# Patient Record
Sex: Male | Born: 1975
Health system: Southern US, Community
[De-identification: ages and names within clinical notes are randomized; demographics above are authoritative.]

## PROBLEM LIST (undated history)

## (undated) DIAGNOSIS — T7840XA Allergy, unspecified, initial encounter: Secondary | ICD-10-CM

## (undated) DIAGNOSIS — J40 Bronchitis, not specified as acute or chronic: Secondary | ICD-10-CM

## (undated) DIAGNOSIS — C801 Malignant (primary) neoplasm, unspecified: Secondary | ICD-10-CM

## (undated) HISTORY — DX: Allergy, unspecified, initial encounter: T78.40XA

## (undated) HISTORY — PX: OTHER SURGICAL HISTORY: SHX169

## (undated) HISTORY — PX: EYE SURGERY: SHX253

## (undated) HISTORY — PX: TONSILLECTOMY: SUR1361

---

## 2012-09-18 ENCOUNTER — Ambulatory Visit (INDEPENDENT_AMBULATORY_CARE_PROVIDER_SITE_OTHER): Payer: Managed Care, Other (non HMO) | Admitting: Family Medicine

## 2012-09-18 VITALS — BP 130/84 | HR 88 | Temp 97.6°F | Resp 16 | Ht 71.75 in | Wt 220.0 lb

## 2012-09-18 DIAGNOSIS — R21 Rash and other nonspecific skin eruption: Secondary | ICD-10-CM

## 2012-09-18 LAB — COMPREHENSIVE METABOLIC PANEL
ALT: 33 U/L (ref 0–53)
AST: 19 U/L (ref 0–37)
Albumin: 4.4 g/dL (ref 3.5–5.2)
Alkaline Phosphatase: 103 U/L (ref 39–117)
BUN: 11 mg/dL (ref 6–23)
CO2: 28 mEq/L (ref 19–32)
Calcium: 9.6 mg/dL (ref 8.4–10.5)
Chloride: 104 mEq/L (ref 96–112)
Creat: 0.76 mg/dL (ref 0.50–1.35)
Glucose, Bld: 95 mg/dL (ref 70–99)
Potassium: 4.3 mEq/L (ref 3.5–5.3)
Sodium: 140 mEq/L (ref 135–145)
Total Bilirubin: 0.6 mg/dL (ref 0.3–1.2)
Total Protein: 7.6 g/dL (ref 6.0–8.3)

## 2012-09-18 LAB — POCT CBC
Granulocyte percent: 67.1 %G (ref 37–80)
HCT, POC: 52.4 % (ref 43.5–53.7)
Hemoglobin: 16.4 g/dL (ref 14.1–18.1)
Lymph, poc: 2 (ref 0.6–3.4)
MCH, POC: 28.4 pg (ref 27–31.2)
MCHC: 31.3 g/dL — AB (ref 31.8–35.4)
MCV: 90.8 fL (ref 80–97)
MID (cbc): 0.7 (ref 0–0.9)
MPV: 8.2 fL (ref 0–99.8)
POC Granulocyte: 5.4 (ref 2–6.9)
POC LYMPH PERCENT: 24.5 %L (ref 10–50)
POC MID %: 8.4 %M (ref 0–12)
Platelet Count, POC: 300 10*3/uL (ref 142–424)
RBC: 5.77 M/uL (ref 4.69–6.13)
RDW, POC: 14.2 %
WBC: 8 10*3/uL (ref 4.6–10.2)

## 2012-09-18 LAB — POCT SEDIMENTATION RATE: POCT SED RATE: 15 mm/hr (ref 0–22)

## 2012-09-18 MED ORDER — KETOCONAZOLE 2 % EX CREA: TOPICAL_CREAM | Freq: Two times a day (BID) | CUTANEOUS | Status: DC

## 2012-09-18 MED ORDER — DOXYCYCLINE HYCLATE 100 MG PO TABS: 100.0000 mg | ORAL_TABLET | Freq: Two times a day (BID) | ORAL | Status: DC

## 2012-09-18 NOTE — Progress Notes (Signed)
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 It began about a week ago it is nonpruritic and nonpainful. The onset was associated with malleus for one day. He's tried antifungal creams but hasn't worked.  Patient denies subsequent fever, headache, stiff neck, chest pain, shortness of breath  Objective: Patient has a 3-4 cm in diameter erythematous slightly raised rash which is confluent along the perimeter but does show some central clearing. There is increased pink margins at the periphery. The rash does not blanch.  Assessment: Petechial rash in an annular distribution just above the right ankle which is consistent with Lyme disease or other tickborne pathogens  Plan: Check labs, doxycycline, Nizoral cream 1. Rash  POCT CBC, POCT SEDIMENTATION RATE, Comprehensive metabolic panel, B. burgdorfi antibodies, [place] spotted fvr ab, IgM-blood, doxycycline (VIBRA-TABS) 100 MG tablet, ketoconazole (NIZORAL) 2 % cream

## 2012-09-19 LAB — B. BURGDORFI ANTIBODIES: B burgdorferi Ab IgG+IgM: 0.23 {ISR}

## 2012-09-19 LAB — ROCKY MTN SPOTTED FVR AB, IGM-BLOOD: ROCKY MTN SPOTTED FEVER, IGM: 0.32 IV

## 2013-06-03 ENCOUNTER — Ambulatory Visit (INDEPENDENT_AMBULATORY_CARE_PROVIDER_SITE_OTHER): Payer: Self-pay | Admitting: Internal Medicine

## 2013-06-03 VITALS — BP 159/88 | HR 114 | Temp 100.5°F | Resp 18 | Ht 71.75 in | Wt 221.8 lb

## 2013-06-03 DIAGNOSIS — J029 Acute pharyngitis, unspecified: Secondary | ICD-10-CM

## 2013-06-03 DIAGNOSIS — R112 Nausea with vomiting, unspecified: Secondary | ICD-10-CM

## 2013-06-03 DIAGNOSIS — R21 Rash and other nonspecific skin eruption: Secondary | ICD-10-CM

## 2013-06-03 DIAGNOSIS — R509 Fever, unspecified: Secondary | ICD-10-CM

## 2013-06-03 MED ORDER — DOXYCYCLINE HYCLATE 100 MG PO TABS: 100.0000 mg | ORAL_TABLET | Freq: Two times a day (BID) | ORAL | Status: DC

## 2013-06-03 MED ORDER — ONDANSETRON HCL 8 MG PO TABS: 8.0000 mg | ORAL_TABLET | Freq: Three times a day (TID) | ORAL | Status: DC | PRN

## 2013-06-03 MED ORDER — ONDANSETRON 4 MG PO TBDP
8.0000 mg | ORAL_TABLET | Freq: Once | ORAL | Status: AC
  Administered 2013-06-03: 8 mg via ORAL

## 2013-06-03 NOTE — Progress Notes (Signed)
  Subjective:    Patient ID: Scott Turner, male    DOB: 26-Jul-1976, 37 y.o.   MRN: [id number]  HPI Pt here c/o of cough, sore throat, fever of 99.8, and vomiting x 3, body aches . Started yesterday. Denies headache, diarrhea, or abdominal pain. Decreased appetite, urinating okay , is a little dizzy. No alcohol use.   Zofran odt 8mg  now po   Review of Systems     Objective:   Physical Exam  Vitals reviewed. Constitutional: He is oriented to person, place, and time. He appears well-developed and well-nourished. No distress.  HENT:  Right Ear: External ear normal.  Left Ear: External ear normal.  Eyes: EOM are normal. No scleral icterus.  Neck: Normal range of motion.  Cardiovascular: Normal rate, regular rhythm and normal heart sounds.   Pulmonary/Chest: Effort normal and breath sounds normal.  Abdominal: Soft. Bowel sounds are normal. There is no tenderness.  Neurological: He is alert and oriented to person, place, and time. He exhibits normal muscle tone. Coordination normal.  Skin: No rash noted.  Psychiatric: He has a normal mood and affect.      rst    Results for orders placed in visit on 06/03/13  POCT RAPID STREP A (OFFICE)      Result Value Range   Rapid Strep A Screen Negative  Negative    Assessment & Plan:  Zofran/Doxy

## 2013-06-03 NOTE — Patient Instructions (Addendum)
Wood Tick Bite Ticks are insects that attach themselves to the skin. Most tick bites are harmless, but sometimes ticks carry diseases that can make a person quite ill. The chance of getting ill depends on:  The kind of tick that bites you.  Time of year.  How long the tick is attached.  Geographic location. Wood ticks are also called dog ticks. They are generally black. They can have white markings. They live in shrubs and grassy areas. They are larger than deer ticks. Wood ticks are about the size of a watermelon seed. They have a hard body. The most common places for ticks to attach themselves are the scalp, neck, armpits, waist, and groin. Wood ticks may stay attached for up to 2 weeks. TICKS MUST BE REMOVED AS SOON AS POSSIBLE TO HELP PREVENT DISEASES CAUSED BY TICK BITES.  TO REMOVE A TICK: 1. If available, put on latex gloves before trying to remove a tick. 2. Grasp the tick as close to the skin as possible, with curved forceps, fine tweezers or a special tick removal tool. 3. Pull gently with steady pressure until the tick lets go. Do not twist the tick or jerk it suddenly. This may break off the tick's head or mouth parts. 4. Do not crush the tick's body. This could force disease-carrying fluids from the tick into your body. 5. After the tick is removed, wash the bite area and your hands with soap and water or other disinfectant. 6. Apply a small amount of antiseptic cream or ointment to the bite site. 7. Wash and disinfect any instruments that were used. 8. Save the tick in a jar or plastic bag for later identification. Preserve the tick with a bit of alcohol or put it in the freezer. 9. Do not apply a hot match, petroleum jelly, or fingernail polish to the tick. This does not work and may increase the chances of disease from the tick bite. YOU MAY NEED TO SEE YOUR CAREGIVER FOR A TETANUS SHOT NOW IF:  You have no idea when you had the last one.  You have never had a tetanus shot  before. If you need a tetanus shot, and you decide not to get one, there is a rare chance of getting tetanus. Sickness from tetanus can be serious. If you get a tetanus shot, your arm may swell, get red and warm to the touch at the shot site. This is common and not a problem. PREVENTION  Wear protective clothing. Long sleeves and pants are best.  Wear white clothes to see ticks more easily  Tuck your pant legs into your socks.  If walking on trail, stay in the middle of the trail to avoid brushing against bushes.  Put insect repellent on all exposed skin and along boot tops, pant legs and sleeve cuffs  Check clothing, hair and skin repeatedly and before coming inside.  Brush off any ticks that are not attached. SEEK MEDICAL CARE IF:   You cannot remove a tick or part of the tick that is left in the skin.  Unexplained fever.  Redness and swelling in the area of the tick bite.  Tender, swollen lymph glands.  Diarrhea.  Weight loss.  Cough.  Fatigue.  Muscle, joint or bone pain.  Belly pain.  Headache.  Rash. SEEK IMMEDIATE MEDICAL CARE IF:   You develop an oral temperature above 102 F (38.9 C).  You are having trouble walking or moving your legs.  Numbness in the legs.    Shortness of breath.  Confusion.  Repeated vomiting. Document Released: 12/10/2000 Document Revised: 03/06/2012 Document Reviewed: 11/18/2008 ExitCare Patient Information 2014 [employer]. Fever  Fever is a higher-than-normal body temperature. A normal temperature varies with:  Age.  How it is measured (mouth, underarm, rectal, or ear).  Time of day. In an adult, an oral temperature around 98.6 Fahrenheit (F) or 37 Celsius (C) is considered normal. A rise in temperature of about 1.8 F or 1 C is generally considered a fever (100.4 F or 38 C). In an infant age 28 days or less, a rectal temperature of 100.4 F (38 C) generally is regarded as fever. Fever is not a disease but  can be a symptom of illness. CAUSES   Fever is most commonly caused by infection.  Some non-infectious problems can cause fever. For example:  Some arthritis problems.  Problems with the thyroid or adrenal glands.  Immune system problems.  Some kinds of cancer.  A reaction to certain medicines.  Occasionally, the source of a fever cannot be determined. This is sometimes called a "Fever of Unknown Origin" (FUO).  Some situations may lead to a temporary rise in body temperature that may go away on its own. Examples are:  Childbirth.  Surgery.  Some situations may cause a rise in body temperature but these are not considered "true fever". Examples are:  Intense exercise.  Dehydration.  Exposure to high outside or room temperatures. SYMPTOMS   Feeling warm or hot.  Fatigue or feeling exhausted.  Aching all over.  Chills.  Shivering.  Sweats. DIAGNOSIS  A fever can be suspected by your caregiver feeling that your skin is unusually warm. The fever is confirmed by taking a temperature with a thermometer. Temperatures can be taken different ways. Some methods are accurate and some are not: With adults, adolescents, and children:   An oral temperature is used most commonly.  An ear thermometer will only be accurate if it is positioned as recommended by the manufacturer.  Under the arm temperatures are not accurate and not recommended.  Most electronic thermometers are fast and accurate. Infants and Toddlers:  Rectal temperatures are recommended and most accurate.  Ear temperatures are not accurate in this age group and are not recommended.  Skin thermometers are not accurate. RISKS AND COMPLICATIONS   During a fever, the body uses more oxygen, so a person with a fever may develop rapid breathing or shortness of breath. This can be dangerous especially in people with heart or lung disease.  The sweats that occur following a fever can cause dehydration.  High  fever can cause seizures in infants and children.  Older persons can develop confusion during a fever. TREATMENT   Medications may be used to control temperature.  Do not give aspirin to children with fevers. There is an association with Reye's syndrome. Reye's syndrome is a rare but potentially deadly disease.  If an infection is present and medications have been prescribed, take them as directed. Finish the full course of medications until they are gone.  Sponging or bathing with room-temperature water may help reduce body temperature. Do not use ice water or alcohol sponge baths.  Do not over-bundle children in blankets or heavy clothes.  Drinking adequate fluids during an illness with fever is important to prevent dehydration. HOME CARE INSTRUCTIONS   For adults, rest and adequate fluid intake are important. Dress according to how you feel, but do not over-bundle.  Drink enough water and/or fluids to keep your   urine clear or pale yellow.  For infants over 3 months and children, giving medication as directed by your caregiver to control fever can help with comfort. The amount to be given is based on the child's weight. Do NOT give more than is recommended. SEEK MEDICAL CARE IF:   You or your child are unable to keep fluids down.  Vomiting or diarrhea develops.  You develop a skin rash.  An oral temperature above 102 F (38.9 C) develops, or a fever which persists for over 3 days.  You develop excessive weakness, dizziness, fainting or extreme thirst.  Fevers keep coming back after 3 days. SEEK IMMEDIATE MEDICAL CARE IF:   Shortness of breath or trouble breathing develops  You pass out.  You feel you are making little or no urine.  New pain develops that was not there before (such as in the head, neck, chest, back, or abdomen).  You cannot hold down fluids.  Vomiting and diarrhea persist for more than a day or two.  You develop a stiff neck and/or your eyes become  sensitive to light.  An unexplained temperature above 102 F (38.9 C) develops. Document Released: 12/13/2005 Document Revised: 03/06/2012 Document Reviewed: 11/28/2008 ExitCare Patient Information 2014 [employer].  

## 2016-12-22 DIAGNOSIS — J209 Acute bronchitis, unspecified: Secondary | ICD-10-CM | POA: Diagnosis not present

## 2016-12-22 DIAGNOSIS — R030 Elevated blood-pressure reading, without diagnosis of hypertension: Secondary | ICD-10-CM | POA: Diagnosis not present

## 2016-12-31 DIAGNOSIS — K429 Umbilical hernia without obstruction or gangrene: Secondary | ICD-10-CM | POA: Diagnosis not present

## 2017-01-13 ENCOUNTER — Ambulatory Visit: Payer: Self-pay | Admitting: Surgery

## 2017-01-13 DIAGNOSIS — K420 Umbilical hernia with obstruction, without gangrene: Secondary | ICD-10-CM | POA: Diagnosis not present

## 2017-01-13 NOTE — H&P (Signed)
[address] 01/13/2017 10:37 AM Location: [facility] Patient #: [id number] DOB: [birth year] Married / Language: [name] / Race: White Male  History of Present Illness (Purcell Jungbluth [name] MD; 01/13/2017 10:48 AM) Patient words: Otherwise healthy 41 year old gentleman who presents with an incarcerated umbilical hernia. He first noticed it a few months ago as an increased bulge at the umbilicus. At that time it did not cause him any discomfort. In the last few weeks however, he has noticed discomfort in the area which is scribed as a constant dull ache with occasional burning twinges. He denies any nausea, emesis, change in bowel habits or abdominal distention. He has never been able to reduce the mass of the umbilicus.  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  The patient is a 41 year old male.   Past Surgical History [name], CMA; 01/13/2017 10:37 AM) Tonsillectomy  Diagnostic Studies History [name], CMA; 01/13/2017 10:37 AM) Colonoscopy never  Allergies [name], CMA; 01/13/2017 10:38 AM) No Known Drug Allergies 01/13/2017  Medication History [name], CMA; 01/13/2017 10:38 AM) No Current Medications Medications Reconciled  Social History [name], CMA; 01/13/2017 10:37 AM) Alcohol use Moderate alcohol use. Caffeine use Carbonated beverages. Illicit drug use Uses socially only. Tobacco use Former smoker.  Family History [name], CMA; 01/13/2017 10:37 AM) Cancer Mother. Cerebrovascular Accident Father. Diabetes Mellitus Father. Heart Disease Father. Heart disease in male family member before age 25 Hypertension Father. Seizure disorder Daughter.     Review of Systems [name] CMA; 01/13/2017 10:37 AM) General Not Present- Appetite Loss, Chills, Fatigue, Fever, Night Sweats, Weight Gain and Weight Loss. Skin Not  Present- Change in Wart/Mole, Dryness, Hives, Jaundice, New Lesions, Non-Healing Wounds, Rash and Ulcer. HEENT Not Present- Earache, Hearing Loss, Hoarseness, Nose Bleed, Oral Ulcers, Ringing in the Ears, Seasonal Allergies, Sinus Pain, Sore Throat, Visual Disturbances, Wears glasses/contact lenses and Yellow Eyes. Respiratory Not Present- Bloody sputum, Chronic Cough, Difficulty Breathing, Snoring and Wheezing. Breast Not Present- Breast Mass, Breast Pain, Nipple Discharge and Skin Changes. Cardiovascular Not Present- Chest Pain, Difficulty Breathing Lying Down, Leg Cramps, Palpitations, Rapid Heart Rate, Shortness of Breath and Swelling of Extremities. Gastrointestinal Present- Abdominal Pain. Not Present- Bloating, Bloody Stool, Change in Bowel Habits, Chronic diarrhea, Constipation, Difficulty Swallowing, Excessive gas, Gets full quickly at meals, Hemorrhoids, Indigestion, Nausea, Rectal Pain and Vomiting. Male Genitourinary Not Present- Blood in Urine, Change in Urinary Stream, Frequency, Impotence, Nocturia, Painful Urination, Urgency and Urine Leakage. Musculoskeletal Not Present- Back Pain, Joint Pain, Joint Stiffness, Muscle Pain, Muscle Weakness and Swelling of Extremities. Neurological Not Present- Decreased Memory, Fainting, Headaches, Numbness, Seizures, Tingling, Tremor, Trouble walking and Weakness. Psychiatric Not Present- Anxiety, Bipolar, Change in Sleep Pattern, Depression, Fearful and Frequent crying. Endocrine Not Present- Cold Intolerance, Excessive Hunger, Hair Changes, Heat Intolerance, Hot flashes and New Diabetes. Hematology Not Present- Blood Thinners, Easy Bruising, Excessive bleeding, Gland problems, HIV and Persistent Infections.  Vitals ([name] CMA; 01/13/2017 10:37 AM) 01/13/2017 10:37 AM Weight: 254.6 lb Height: 72in Body Surface Area: 2.36 m Body Mass Index: 34.53 kg/m  Temp.: 98.42F(Oral)  Pulse: 71 (Regular)  BP: 152/98 (Sitting, Left Arm,  Standard)      Physical Exam (Tamzin Bertling [name] MD; 01/13/2017 10:51 AM)  General Mental Status-Alert. General Appearance-Consistent with stated age. Hydration-Well hydrated. Voice-Normal.  Head and Neck Head-normocephalic, atraumatic with no lesions or palpable masses. Trachea-midline.  Eye Eyeball -  Bilateral-Extraocular movements intact. Sclera/Conjunctiva - Bilateral-No scleral icterus.  Chest and Lung Exam Chest and lung exam reveals -quiet, even and easy respiratory effort with no use of accessory muscles and on auscultation, normal breath sounds, no adventitious sounds and normal vocal resonance. Inspection Chest Wall - Normal. Back - normal.  Cardiovascular Cardiovascular examination reveals -normal heart sounds, regular rate and rhythm with no murmurs and normal pedal pulses bilaterally.  Abdomen Inspection Skin - Scar - no surgical scars. Hernias - Umbilical hernia - Incarcerated. Note: 1cm firm mass at umbilicus with mild overlying skin erythema. Minimally tender. Fascial defect not palpable. Palpation/Percussion Palpation and Percussion of the abdomen reveal - Soft, Non Tender, No Rebound tenderness, No Rigidity (guarding) and No hepatosplenomegaly.  Neurologic Neurologic evaluation reveals -alert and oriented x 3 with no impairment of recent or remote memory. Mental Status-Normal.  Neuropsychiatric The patient's mood and affect are described as -normal. Judgment and Insight-insight is appropriate concerning matters relevant to self.  Musculoskeletal Normal Exam - Left-Upper Extremity Strength Normal and Lower Extremity Strength Normal. Normal Exam - Right-Upper Extremity Strength Normal, Lower Extremity Weakness.    Assessment & Plan (Wyett Narine A. Tauheed Mcfayden MD; 01/13/2017 10:53 AM)  [name] UMBILICAL HERNIA ([id number]) Story: Several months duration with slowly increasing symptoms of pain and size. Will plan for open repair.  Discussed the surgery, risks of bleeding, infection, pain, scarring, recurrence of hernia. Discussed need for no lifting or strenuous activity for 6 weeks post-op but return to work with those restrictions ok after about 1 week. He expressed understanding and wishes to proceed with surgery as soon as possible.  Current Plans You are being scheduled for surgery- Our schedulers will call you.  You should hear from our office's scheduling department within 5 working days about the location, date, and time of surgery. We try to make accommodations for patient's preferences in scheduling surgery, but sometimes the OR schedule or the surgeon's schedule prevents [place] from making those accommodations.  If you have not heard from our office [phone]) in 5 working days, call the office and ask for your surgeon's nurse.  If you have other questions about your diagnosis, plan, or surgery, call the office and ask for your surgeon's nurse.

## 2017-01-20 ENCOUNTER — Encounter (HOSPITAL_COMMUNITY): Payer: Self-pay

## 2017-01-20 DIAGNOSIS — Z23 Encounter for immunization: Secondary | ICD-10-CM | POA: Diagnosis not present

## 2017-01-20 DIAGNOSIS — Z0000 Encounter for general adult medical examination without abnormal findings: Secondary | ICD-10-CM | POA: Diagnosis not present

## 2017-01-20 NOTE — Patient Instructions (Addendum)
JALYN PLUMBER  01/20/2017   Your procedure is scheduled on: 01-25-17  Report to [facility] Main  Entrance take [facility]  elevators to 3rd floor to  [place] at [phone].  Call this number if you have problems the morning of surgery [phone]   Remember: ONLY 1 PERSON MAY GO WITH YOU TO SHORT STAY TO GET  READY MORNING OF [place].  Do not eat food or drink liquids :After Midnight.     Take these medicines the morning of surgery with A SIP OF WATER: loratadine(Claritine), fluticasone(Flonase), PRO-AIR inhaler as needed and bring               You may not have any metal on your body including hair pins and              piercings  Do not wear jewelry,  lotions, powders or perfumes, deodorant                         Men may shave face and neck.   Do not bring valuables to the hospital. [place].  Contacts, dentures or bridgework may not be worn into surgery.  Leave suitcase in the car. After surgery it may be brought to your room.     Patients discharged the day of surgery will not be allowed to drive home.  Name and phone number of your driver:  Special Instructions: N/A              Please read over the following fact sheets you were given: _____________________________________________________________________             [facility] - Preparing for Surgery Before surgery, you can play an important role.  Because skin is not sterile, your skin needs to be as free of germs as possible.  You can reduce the number of germs on your skin by washing with CHG (chlorahexidine gluconate) soap before surgery.  CHG is an antiseptic cleaner which kills germs and bonds with the skin to continue killing germs even after washing. Please DO NOT use if you have an allergy to CHG or antibacterial soaps.  If your skin becomes reddened/irritated stop using the CHG and inform your nurse when you arrive at Short Stay. Do  not shave (including legs and underarms) for at least 48 hours prior to the first CHG shower.  You may shave your face/neck. Please follow these instructions carefully:  1.  Shower with CHG Soap the night before surgery and the  morning of Surgery.  2.  If you choose to wash your hair, wash your hair first as usual with your  normal  shampoo.  3.  After you shampoo, rinse your hair and body thoroughly to remove the  shampoo.                           4.  Use CHG as you would any other liquid soap.  You can apply chg directly  to the skin and wash                       Gently with a scrungie or clean washcloth.  5.  Apply the CHG Soap to your  body ONLY FROM THE NECK DOWN.   Do not use on face/ open                           Wound or open sores. Avoid contact with eyes, ears mouth and genitals (private parts).                       Wash face,  Genitals (private parts) with your normal soap.             6.  Wash thoroughly, paying special attention to the area where your surgery  will be performed.  7.  Thoroughly rinse your body with warm water from the neck down.  8.  DO NOT shower/wash with your normal soap after using and rinsing off  the CHG Soap.                9.  Pat yourself dry with a clean towel.            10.  Wear clean pajamas.            11.  Place clean sheets on your bed the night of your first shower and do not  sleep with pets. Day of Surgery : Do not apply any lotions/deodorants the morning of surgery.  Please wear clean clothes to the hospital/surgery center.  FAILURE TO FOLLOW THESE INSTRUCTIONS MAY RESULT IN THE CANCELLATION OF YOUR SURGERY PATIENT SIGNATURE_________________________________  NURSE SIGNATURE__________________________________  ________________________________________________________________________

## 2017-01-24 ENCOUNTER — Encounter (HOSPITAL_COMMUNITY): Payer: Self-pay

## 2017-01-24 ENCOUNTER — Encounter (HOSPITAL_COMMUNITY)
Admission: RE | Admit: 2017-01-24 | Discharge: 2017-01-24 | Disposition: A | Discharge status: Home / Self Care | Payer: BLUE CROSS/BLUE SHIELD | Source: Ambulatory Visit | Attending: Surgery | Admitting: Surgery

## 2017-01-24 ENCOUNTER — Encounter (INDEPENDENT_AMBULATORY_CARE_PROVIDER_SITE_OTHER): Payer: Self-pay

## 2017-01-24 DIAGNOSIS — K420 Umbilical hernia with obstruction, without gangrene: Secondary | ICD-10-CM | POA: Diagnosis not present

## 2017-01-24 DIAGNOSIS — Z79899 Other long term (current) drug therapy: Secondary | ICD-10-CM | POA: Diagnosis not present

## 2017-01-24 DIAGNOSIS — Z87891 Personal history of nicotine dependence: Secondary | ICD-10-CM | POA: Diagnosis not present

## 2017-01-24 HISTORY — DX: Malignant (primary) neoplasm, unspecified: C80.1

## 2017-01-24 LAB — CBC
HCT: 44 % (ref 39.0–52.0)
Hemoglobin: 14.8 g/dL (ref 13.0–17.0)
MCH: 28.7 pg (ref 26.0–34.0)
MCHC: 33.6 g/dL (ref 30.0–36.0)
MCV: 85.4 fL (ref 78.0–100.0)
PLATELETS: 256 10*3/uL (ref 150–400)
RBC: 5.15 MIL/uL (ref 4.22–5.81)
RDW: 13.2 % (ref 11.5–15.5)
WBC: 9.1 10*3/uL (ref 4.0–10.5)

## 2017-01-25 ENCOUNTER — Ambulatory Visit (HOSPITAL_COMMUNITY): Payer: BLUE CROSS/BLUE SHIELD | Admitting: Anesthesiology

## 2017-01-25 ENCOUNTER — Encounter (HOSPITAL_COMMUNITY): Payer: Self-pay | Admitting: *Deleted

## 2017-01-25 ENCOUNTER — Ambulatory Visit (HOSPITAL_COMMUNITY)
Admission: RE | Admit: 2017-01-25 | Discharge: 2017-01-25 | Disposition: A | Discharge status: Home / Self Care | Payer: BLUE CROSS/BLUE SHIELD | Source: Ambulatory Visit | Attending: Surgery | Admitting: Surgery

## 2017-01-25 ENCOUNTER — Encounter (HOSPITAL_COMMUNITY)
Admission: RE | Disposition: A | Discharge status: Home / Self Care | Payer: Self-pay | Source: Ambulatory Visit | Attending: Surgery

## 2017-01-25 DIAGNOSIS — K420 Umbilical hernia with obstruction, without gangrene: Secondary | ICD-10-CM | POA: Insufficient documentation

## 2017-01-25 DIAGNOSIS — Z87891 Personal history of nicotine dependence: Secondary | ICD-10-CM | POA: Insufficient documentation

## 2017-01-25 DIAGNOSIS — K429 Umbilical hernia without obstruction or gangrene: Secondary | ICD-10-CM | POA: Diagnosis not present

## 2017-01-25 DIAGNOSIS — Z79899 Other long term (current) drug therapy: Secondary | ICD-10-CM | POA: Diagnosis not present

## 2017-01-25 HISTORY — DX: Bronchitis, not specified as acute or chronic: J40

## 2017-01-25 HISTORY — PX: UMBILICAL HERNIA REPAIR: SHX196

## 2017-01-25 SURGERY — REPAIR, HERNIA, UMBILICAL, ADULT
Anesthesia: General | Site: Abdomen

## 2017-01-25 MED ORDER — MIDAZOLAM HCL 5 MG/5ML IJ SOLN
INTRAMUSCULAR | Status: DC | PRN
  Administered 2017-01-25: 2 mg via INTRAVENOUS

## 2017-01-25 MED ORDER — CEFAZOLIN SODIUM-DEXTROSE 2-4 GM/100ML-% IV SOLN
2.0000 g | INTRAVENOUS | Status: AC
  Administered 2017-01-25: 2 g via INTRAVENOUS

## 2017-01-25 MED ORDER — BUPIVACAINE HCL (PF) 0.25 % IJ SOLN
INTRAMUSCULAR | Status: AC
  Filled 2017-01-25: qty 30

## 2017-01-25 MED ORDER — SUCCINYLCHOLINE CHLORIDE 200 MG/10ML IV SOSY
PREFILLED_SYRINGE | INTRAVENOUS | Status: AC
  Filled 2017-01-25: qty 10

## 2017-01-25 MED ORDER — ONDANSETRON HCL 4 MG/2ML IJ SOLN
INTRAMUSCULAR | Status: DC | PRN
  Administered 2017-01-25: 4 mg via INTRAVENOUS

## 2017-01-25 MED ORDER — HYDROMORPHONE HCL 1 MG/ML IJ SOLN
0.2500 mg | INTRAMUSCULAR | Status: DC | PRN
  Administered 2017-01-25 (×4): 0.5 mg via INTRAVENOUS

## 2017-01-25 MED ORDER — KETOROLAC TROMETHAMINE 30 MG/ML IJ SOLN
INTRAMUSCULAR | Status: DC | PRN
  Administered 2017-01-25: 30 mg via INTRAMUSCULAR
  Administered 2017-01-25: 30 mg via INTRAVENOUS

## 2017-01-25 MED ORDER — HYDRALAZINE HCL 20 MG/ML IJ SOLN
5.0000 mg | Freq: Once | INTRAMUSCULAR | Status: AC
  Administered 2017-01-25: 5 mg via INTRAVENOUS

## 2017-01-25 MED ORDER — FENTANYL CITRATE (PF) 100 MCG/2ML IJ SOLN
INTRAMUSCULAR | Status: DC | PRN
  Administered 2017-01-25 (×2): 50 ug via INTRAVENOUS

## 2017-01-25 MED ORDER — SODIUM CHLORIDE 0.9 % IV SOLN: 250.0000 mL | INTRAVENOUS | Status: DC | PRN

## 2017-01-25 MED ORDER — LIDOCAINE 2% (20 MG/ML) 5 ML SYRINGE
INTRAMUSCULAR | Status: DC | PRN
  Administered 2017-01-25: 100 mg via INTRAVENOUS

## 2017-01-25 MED ORDER — PROMETHAZINE HCL 25 MG/ML IJ SOLN: 6.2500 mg | INTRAMUSCULAR | Status: DC | PRN

## 2017-01-25 MED ORDER — CHLORHEXIDINE GLUCONATE CLOTH 2 % EX PADS: 6.0000 | MEDICATED_PAD | Freq: Once | CUTANEOUS | Status: DC

## 2017-01-25 MED ORDER — SODIUM CHLORIDE 0.9% FLUSH: 3.0000 mL | INTRAVENOUS | Status: DC | PRN

## 2017-01-25 MED ORDER — PROPOFOL 10 MG/ML IV BOLUS
INTRAVENOUS | Status: DC | PRN
  Administered 2017-01-25: 250 mg via INTRAVENOUS

## 2017-01-25 MED ORDER — OXYCODONE-ACETAMINOPHEN 5-325 MG PO TABS: 1.0000 | ORAL_TABLET | Freq: Four times a day (QID) | ORAL | Status: DC | PRN

## 2017-01-25 MED ORDER — OXYCODONE HCL 5 MG PO TABS: 5.0000 mg | ORAL_TABLET | ORAL | Status: DC | PRN

## 2017-01-25 MED ORDER — ACETAMINOPHEN 650 MG RE SUPP: 650.0000 mg | RECTAL | Status: DC | PRN

## 2017-01-25 MED ORDER — FENTANYL CITRATE (PF) 100 MCG/2ML IJ SOLN
INTRAMUSCULAR | Status: AC
  Filled 2017-01-25: qty 2

## 2017-01-25 MED ORDER — ONDANSETRON HCL 4 MG/2ML IJ SOLN
INTRAMUSCULAR | Status: AC
  Filled 2017-01-25: qty 2

## 2017-01-25 MED ORDER — CEFAZOLIN SODIUM-DEXTROSE 2-4 GM/100ML-% IV SOLN
INTRAVENOUS | Status: AC
  Filled 2017-01-25: qty 100

## 2017-01-25 MED ORDER — OXYCODONE-ACETAMINOPHEN 5-325 MG PO TABS: 1.0000 | ORAL_TABLET | Freq: Four times a day (QID) | ORAL | 0 refills | Status: DC | PRN

## 2017-01-25 MED ORDER — HYDROMORPHONE HCL 1 MG/ML IJ SOLN
INTRAMUSCULAR | Status: AC
  Filled 2017-01-25: qty 1

## 2017-01-25 MED ORDER — MIDAZOLAM HCL 2 MG/2ML IJ SOLN
INTRAMUSCULAR | Status: AC
  Filled 2017-01-25: qty 2

## 2017-01-25 MED ORDER — ACETAMINOPHEN 325 MG PO TABS: 650.0000 mg | ORAL_TABLET | ORAL | Status: DC | PRN

## 2017-01-25 MED ORDER — 0.9 % SODIUM CHLORIDE (POUR BTL) OPTIME
TOPICAL | Status: DC | PRN
  Administered 2017-01-25: 1000 mL

## 2017-01-25 MED ORDER — LIDOCAINE 2% (20 MG/ML) 5 ML SYRINGE
INTRAMUSCULAR | Status: AC
  Filled 2017-01-25: qty 5

## 2017-01-25 MED ORDER — SODIUM CHLORIDE 0.9% FLUSH: 3.0000 mL | Freq: Two times a day (BID) | INTRAVENOUS | Status: DC

## 2017-01-25 MED ORDER — KETOROLAC TROMETHAMINE 30 MG/ML IJ SOLN: 30.0000 mg | Freq: Once | INTRAMUSCULAR | Status: DC | PRN

## 2017-01-25 MED ORDER — DEXAMETHASONE SODIUM PHOSPHATE 10 MG/ML IJ SOLN
INTRAMUSCULAR | Status: AC
  Filled 2017-01-25: qty 1

## 2017-01-25 MED ORDER — LACTATED RINGERS IV SOLN
INTRAVENOUS | Status: DC | PRN
  Administered 2017-01-25: 07:00:00 via INTRAVENOUS
  Administered 2017-01-25: 1000 mL

## 2017-01-25 MED ORDER — DEXAMETHASONE SODIUM PHOSPHATE 10 MG/ML IJ SOLN
INTRAMUSCULAR | Status: DC | PRN
  Administered 2017-01-25: 10 mg via INTRAVENOUS

## 2017-01-25 MED ORDER — BUPIVACAINE HCL (PF) 0.25 % IJ SOLN
INTRAMUSCULAR | Status: DC | PRN
  Administered 2017-01-25: 20 mL

## 2017-01-25 MED ORDER — DOCUSATE SODIUM 100 MG PO CAPS: 100.0000 mg | ORAL_CAPSULE | Freq: Two times a day (BID) | ORAL | 0 refills | Status: AC

## 2017-01-25 MED ORDER — FENTANYL CITRATE (PF) 100 MCG/2ML IJ SOLN: 25.0000 ug | INTRAMUSCULAR | Status: DC | PRN

## 2017-01-25 MED ORDER — HYDRALAZINE HCL 20 MG/ML IJ SOLN
INTRAMUSCULAR | Status: AC
  Filled 2017-01-25: qty 1

## 2017-01-25 MED ORDER — PROPOFOL 10 MG/ML IV BOLUS
INTRAVENOUS | Status: AC
  Filled 2017-01-25: qty 20

## 2017-01-25 SURGICAL SUPPLY — 29 items
BENZOIN TINCTURE PRP APPL 2/3 (GAUZE/BANDAGES/DRESSINGS) IMPLANT
CHLORAPREP W/TINT 26ML (MISCELLANEOUS) ×4 IMPLANT
CLOSURE WOUND 1/2 X4 (GAUZE/BANDAGES/DRESSINGS)
COVER SURGICAL LIGHT HANDLE (MISCELLANEOUS) ×4 IMPLANT
DECANTER SPIKE VIAL GLASS SM (MISCELLANEOUS) ×4 IMPLANT
DRAPE LAPAROSCOPIC ABDOMINAL (DRAPES) ×4 IMPLANT
DRSG TEGADERM 4X4.75 (GAUZE/BANDAGES/DRESSINGS) ×4 IMPLANT
ELECT REM PT RETURN 9FT ADLT (ELECTROSURGICAL) ×4
ELECTRODE REM PT RTRN 9FT ADLT (ELECTROSURGICAL) ×2 IMPLANT
GAUZE SPONGE 2X2 8PLY STRL LF (GAUZE/BANDAGES/DRESSINGS) ×2 IMPLANT
GAUZE SPONGE 4X4 12PLY STRL (GAUZE/BANDAGES/DRESSINGS) IMPLANT
GLOVE BIO SURGEON STRL SZ 6 (GLOVE) ×4 IMPLANT
GLOVE INDICATOR 6.5 STRL GRN (GLOVE) ×4 IMPLANT
GOWN STRL REUS W/TWL LRG LVL3 (GOWN DISPOSABLE) ×4 IMPLANT
GOWN STRL REUS W/TWL XL LVL3 (GOWN DISPOSABLE) ×4 IMPLANT
KIT BASIN OR (CUSTOM PROCEDURE TRAY) ×4 IMPLANT
LIQUID BAND (GAUZE/BANDAGES/DRESSINGS) ×4 IMPLANT
NEEDLE HYPO 22GX1.5 SAFETY (NEEDLE) ×4 IMPLANT
PACK GENERAL/GYN (CUSTOM PROCEDURE TRAY) ×4 IMPLANT
SPONGE GAUZE 2X2 STER 10/PKG (GAUZE/BANDAGES/DRESSINGS) ×2
STRIP CLOSURE SKIN 1/2X4 (GAUZE/BANDAGES/DRESSINGS) IMPLANT
SUT ETHIBOND 0 MO6 C/R (SUTURE) ×4 IMPLANT
SUT MNCRL AB 4-0 PS2 18 (SUTURE) ×4 IMPLANT
SUT PROLENE 2 0 CT2 30 (SUTURE) ×8 IMPLANT
SUT VIC AB 3-0 SH 27 (SUTURE)
SUT VIC AB 3-0 SH 27XBRD (SUTURE) IMPLANT
SYR CONTROL 10ML LL (SYRINGE) IMPLANT
TOWEL OR 17X26 10 PK STRL BLUE (TOWEL DISPOSABLE) ×4 IMPLANT
TOWEL OR NON WOVEN STRL DISP B (DISPOSABLE) ×4 IMPLANT

## 2017-01-25 NOTE — H&P (View-Only) (Signed)
Scott Turner 01/13/2017 10:37 AM Location: [facility] Patient #: [id number] DOB: [birth year] Married / Language: Scott Turner / Race: White Male  History of Present Illness (Scott Turner A. Scott Heller MD; 01/13/2017 10:48 AM) Patient words: Otherwise healthy 41 year old gentleman who presents with an incarcerated umbilical hernia. He first noticed it a few months ago as an increased bulge at the umbilicus. At that time it did not cause him any discomfort. In the last few weeks however, he has noticed discomfort in the area which is scribed as a constant dull ache with occasional burning twinges. He denies any nausea, emesis, change in bowel habits or abdominal distention. He has never been able to reduce the mass of the umbilicus.  He works in Engineer, technical sales, and on occasion when Scott Turner and such has to crawl up into ceilings and lift moderately heavy objects.  The patient is a 41 year old male.   Past Surgical History Scott Turner, CMA; 01/13/2017 10:37 AM) Tonsillectomy  Diagnostic Studies History Scott Turner, CMA; 01/13/2017 10:37 AM) Colonoscopy never  Allergies Scott Turner, CMA; 01/13/2017 10:38 AM) No Known Drug Allergies 01/13/2017  Medication History Scott Turner, CMA; 01/13/2017 10:38 AM) No Current Medications Medications Reconciled  Social History Scott Turner, CMA; 01/13/2017 10:37 AM) Alcohol use Moderate alcohol use. Caffeine use Carbonated beverages. Illicit drug use Uses socially only. Tobacco use Former smoker.  Family History Scott Turner, CMA; 01/13/2017 10:37 AM) Cancer Mother. Cerebrovascular Accident Father. Diabetes Mellitus Father. Heart Disease Father. Heart disease in male family member before age 25 Hypertension Father. Seizure disorder Daughter.     Review of Systems Scott Turner CMA; 01/13/2017 10:37 AM) General Not Present- Appetite Loss, Chills, Fatigue, Fever, Night Sweats, Weight Gain and Weight Loss. Skin Not  Present- Change in Wart/Mole, Dryness, Hives, Jaundice, New Lesions, Non-Healing Wounds, Rash and Ulcer. HEENT Not Present- Earache, Hearing Loss, Hoarseness, Nose Bleed, Oral Ulcers, Ringing in the Ears, Seasonal Allergies, Sinus Pain, Sore Throat, Visual Disturbances, Wears glasses/contact lenses and Yellow Eyes. Respiratory Not Present- Bloody sputum, Chronic Cough, Difficulty Breathing, Snoring and Wheezing. Breast Not Present- Breast Mass, Breast Pain, Nipple Discharge and Skin Changes. Cardiovascular Not Present- Chest Pain, Difficulty Breathing Lying Down, Leg Cramps, Palpitations, Rapid Heart Rate, Shortness of Breath and Swelling of Extremities. Gastrointestinal Present- Abdominal Pain. Not Present- Bloating, Bloody Stool, Change in Bowel Habits, Chronic diarrhea, Constipation, Difficulty Swallowing, Excessive gas, Gets full quickly at meals, Hemorrhoids, Indigestion, Nausea, Rectal Pain and Vomiting. Male Genitourinary Not Present- Blood in Urine, Change in Urinary Stream, Frequency, Impotence, Nocturia, Painful Urination, Urgency and Urine Leakage. Musculoskeletal Not Present- Back Pain, Joint Pain, Joint Stiffness, Muscle Pain, Muscle Weakness and Swelling of Extremities. Neurological Not Present- Decreased Memory, Fainting, Headaches, Numbness, Seizures, Tingling, Tremor, Trouble walking and Weakness. Psychiatric Not Present- Anxiety, Bipolar, Change in Sleep Pattern, Depression, Fearful and Frequent crying. Endocrine Not Present- Cold Intolerance, Excessive Hunger, Hair Changes, Heat Intolerance, Hot flashes and New Diabetes. Hematology Not Present- Blood Thinners, Easy Bruising, Excessive bleeding, Gland problems, HIV and Persistent Infections.  Vitals (Scott Turner CMA; 01/13/2017 10:37 AM) 01/13/2017 10:37 AM Weight: 254.6 lb Height: 72in Body Surface Area: 2.36 m Body Mass Index: 34.53 kg/m  Temp.: 98.24F(Oral)  Pulse: 71 (Regular)  BP: 152/98 (Sitting, Left Arm,  Standard)      Physical Exam (Scott Turner A. Scott Heller MD; 01/13/2017 10:51 AM)  General Mental Status-Alert. General Appearance-Consistent with stated age. Hydration-Well hydrated. Voice-Normal.  Head and Neck Head-normocephalic, atraumatic with no lesions or palpable masses. Trachea-midline.  Eye Eyeball -  Bilateral-Extraocular movements intact. Sclera/Conjunctiva - Bilateral-No scleral icterus.  Chest and Lung Exam Chest and lung exam reveals -quiet, even and easy respiratory effort with no use of accessory muscles and on auscultation, normal breath sounds, no adventitious sounds and normal vocal resonance. Inspection Chest Wall - Normal. Back - normal.  Cardiovascular Cardiovascular examination reveals -normal heart sounds, regular rate and rhythm with no murmurs and normal pedal pulses bilaterally.  Abdomen Inspection Skin - Scar - no surgical scars. Hernias - Umbilical hernia - Incarcerated. Note: 1cm firm mass at umbilicus with mild overlying skin erythema. Minimally tender. Fascial defect not palpable. Palpation/Percussion Palpation and Percussion of the abdomen reveal - Soft, Non Tender, No Rebound tenderness, No Rigidity (guarding) and No hepatosplenomegaly.  Neurologic Neurologic evaluation reveals -alert and oriented x 3 with no impairment of recent or remote memory. Mental Status-Normal.  Neuropsychiatric The patient's mood and affect are described as -normal. Judgment and Insight-insight is appropriate concerning matters relevant to self.  Musculoskeletal Normal Exam - Left-Upper Extremity Strength Normal and Lower Extremity Strength Normal. Normal Exam - Right-Upper Extremity Strength Normal, Lower Extremity Weakness.    Assessment & Plan (Scott Turner A. Scott Shein MD; 01/13/2017 10:53 AM)  Scott Turner UMBILICAL HERNIA ([id number]) Story: Several months duration with slowly increasing symptoms of pain and size. Will plan for open repair.  Discussed the surgery, risks of bleeding, infection, pain, scarring, recurrence of hernia. Discussed need for no lifting or strenuous activity for 6 weeks post-op but return to work with those restrictions ok after about 1 week. He expressed understanding and wishes to proceed with surgery as soon as possible.  Current Plans You are being scheduled for surgery- Our schedulers will call you.  You should hear from our office's scheduling department within 5 working days about the location, date, and time of surgery. We try to make accommodations for patient's preferences in scheduling surgery, but sometimes the OR schedule or the surgeon's schedule prevents [place] from making those accommodations.  If you have not heard from our office [phone]) in 5 working days, call the office and ask for your surgeon's nurse.  If you have other questions about your diagnosis, plan, or surgery, call the office and ask for your surgeon's nurse.

## 2017-01-25 NOTE — Anesthesia Procedure Notes (Signed)
Procedure Name: LMA Insertion Date/Time: 01/25/2017 7:27 AM Performed by: Danley Danker L Patient Re-evaluated:Patient Re-evaluated prior to inductionOxygen Delivery Method: Circle system utilized Preoxygenation: Pre-oxygenation with 100% oxygen Intubation Type: IV induction Ventilation: Mask ventilation without difficulty LMA: LMA inserted LMA Size: 5.0 Number of attempts: 1 Placement Confirmation: positive ETCO2 and breath sounds checked- equal and bilateral Tube secured with: Tape Dental Injury: Teeth and Oropharynx as per pre-operative assessment

## 2017-01-25 NOTE — Anesthesia Preprocedure Evaluation (Signed)
Anesthesia Evaluation  Patient identified by MRN, date of birth, ID band Patient awake    Reviewed: Allergy & Precautions, NPO status , Patient's Chart, lab work & pertinent test results  Airway Mallampati: II  TM Distance: >3 FB Neck ROM: Full    Dental no notable dental hx.    Pulmonary neg pulmonary ROS, former smoker,    Pulmonary exam normal breath sounds clear to auscultation       Cardiovascular negative cardio ROS Normal cardiovascular exam Rhythm:Regular Rate:Normal     Neuro/Psych negative neurological ROS  negative psych ROS   GI/Hepatic negative GI ROS, Neg liver ROS,   Endo/Other  negative endocrine ROS  Renal/GU negative Renal ROS  negative genitourinary   Musculoskeletal negative musculoskeletal ROS (+)   Abdominal   Peds negative pediatric ROS (+)  Hematology negative hematology ROS (+)   Anesthesia Other Findings   Reproductive/Obstetrics negative OB ROS                             Anesthesia Physical Anesthesia Plan  ASA: II  Anesthesia Plan: General   Post-op Pain Management:    Induction: Intravenous  Airway Management Planned: Oral ETT  Additional Equipment:   Intra-op Plan:   Post-operative Plan: Extubation in OR  Informed Consent: I have reviewed the patients History and Physical, chart, labs and discussed the procedure including the risks, benefits and alternatives for the proposed anesthesia with the patient or authorized representative who has indicated his/her understanding and acceptance.   Dental advisory given  Plan Discussed with: CRNA and Surgeon  Anesthesia Plan Comments:         Anesthesia Quick Evaluation  

## 2017-01-25 NOTE — Discharge Instructions (Signed)
[facility] _______[facility]  UMBILICAL OR INGUINAL HERNIA REPAIR: POST OP INSTRUCTIONS  Always review your discharge instruction sheet given to you by the facility where your surgery was performed. IF YOU HAVE DISABILITY OR FAMILY LEAVE FORMS, YOU MUST BRING THEM TO THE OFFICE FOR PROCESSING.   DO NOT GIVE THEM TO YOUR DOCTOR.  1. A  prescription for pain medication may be given to you upon discharge.  Take your pain medication as prescribed, if needed.  If narcotic pain medicine is not needed, then you may take acetaminophen (Tylenol) or ibuprofen (Advil) as needed. 2. Take your usually prescribed medications unless otherwise directed. If you need a refill on your pain medication, please contact your pharmacy.  They will contact our office to request authorization. Prescriptions will not be filled after 5 pm or on week-ends. 3. You should follow a light diet the first 24 hours after arrival home, such as soup and crackers, etc.  Be sure to include lots of fluids daily.  Resume your normal diet the day after surgery. 4.Most patients will experience some swelling and bruising around the umbilicus or in the groin and scrotum.  Ice packs and reclining will help.  Swelling and bruising can take several days to resolve.  6. It is common to experience some constipation if taking pain medication after surgery.  Increasing fluid intake and taking a stool softener (such as Colace) will usually help or prevent this problem from occurring.  A mild laxative (Milk of Magnesia or Miralax) should be taken according to package directions if there are no bowel movements after 48 hours. 7. Unless discharge instructions indicate otherwise, you may remove your bandages 24-48 hours after surgery, and you may shower at that time.  You may have steri-strips (small skin tapes) in place directly over the incision.  These strips should be left on the skin for 7-10 days.  If your surgeon used skin glue on the  incision, you may shower in 24 hours.  The glue will flake off over the next 2-3 weeks.  Any sutures or staples will be removed at the office during your follow-up visit. 8. ACTIVITIES:  You may resume regular (light) daily activities beginning the next day--such as daily self-care, walking, climbing stairs--gradually increasing activities as tolerated.  You may have sexual intercourse when it is comfortable.  Refrain from any heavy lifting or straining until approved by your doctor.  a.You may drive when you are no longer taking prescription pain medication, you can comfortably wear a seatbelt, and you can safely maneuver your car and apply brakes. b.RETURN TO WORK:  1 week _____________________________________________  9.You should see your doctor in the office for a follow-up appointment approximately 2-3 weeks after your surgery.  Make sure that you call for this appointment within a day or two after you arrive home to insure a convenient appointment time. 10.OTHER INSTRUCTIONS: _________________________    _____________________________________  WHEN TO CALL YOUR DOCTOR: 1. Fever over 101.0 2. Inability to urinate 3. Nausea and/or vomiting Extreme swelling or bruisingGeneral Anesthesia, Adult, Care After These instructions provide you with information about caring for yourself after your procedure. Your health care provider may also give you more specific instructions. Your treatment has been planned according to current medical practices, but problems sometimes occur. Call your health care provider if you have any problems or questions after your procedure. What can I expect after the procedure? After the procedure, it is common to have: Vomiting. A sore throat. Mental slowness.  It is common to feel: Nauseous. Cold or shivery. Sleepy. Tired. Sore or achy, even in parts of your body where you did not have surgery. Follow these instructions at home: For at least 24 hours after the  procedure: Do not: Participate in activities where you could fall or become injured. Drive. Use heavy machinery. Drink alcohol. Take sleeping pills or medicines that cause drowsiness. Make important decisions or sign legal documents. Take care of children on your own. Rest. Eating and drinking If you vomit, drink water, juice, or soup when you can drink without vomiting. Drink enough fluid to keep your urine clear or pale yellow. Make sure you have little or no nausea before eating solid foods. Follow the diet recommended by your health care provider. General instructions Have a responsible adult stay with you until you are awake and alert. Return to your normal activities as told by your health care provider. Ask your health care provider what activities are safe for you. Take over-the-counter and prescription medicines only as told by your health care provider. If you smoke, do not smoke without supervision. Keep all follow-up visits as told by your health care provider. This is important. Contact a health care provider if: You continue to have nausea or vomiting at home, and medicines are not helpful. You cannot drink fluids or start eating again. You cannot urinate after 8-12 hours. You develop a skin rash. You have fever. You have increasing redness at the site of your procedure. Get help right away if: You have difficulty breathing. You have chest pain. You have unexpected bleeding. You feel that you are having a life-threatening or urgent problem. This information is not intended to replace advice given to you by your health care provider. Make sure you discuss any questions you have with your health care provider. Document Released: 03/21/2001 Document Revised: 05/17/2016 Document Reviewed: 11/27/2015 Elsevier Interactive Patient Education  2017 [place]. 4.  5. Continued bleeding from incision. 6. Increased pain, redness, or drainage from the incision  The clinic  staff is available to answer your questions during regular business hours.  Please dont hesitate to call and ask to speak to one of the nurses for clinical concerns.  If you have a medical emergency, go to the nearest emergency room or call 911.  A surgeon from [facility] is always on call at the hospital   [address] [place], [place], [place]  [address] ?  [address], [place], [place]   [address] [phone] ? [phone] ? FAX [phone] Web site: [email or web address]

## 2017-01-25 NOTE — Anesthesia Postprocedure Evaluation (Addendum)
Anesthesia Post Note  Patient: Scott Turner  Procedure(s) Performed: Procedure(s) (LRB): HERNIA REPAIR UMBILICAL ADULT (N/A)  Patient location during evaluation: PACU Anesthesia Type: General Level of consciousness: awake and alert Pain management: pain level controlled Vital Signs Assessment: post-procedure vital signs reviewed and stable Respiratory status: spontaneous breathing, nonlabored ventilation, respiratory function stable and patient connected to nasal cannula oxygen Cardiovascular status: blood pressure returned to baseline and stable Postop Assessment: no signs of nausea or vomiting Anesthetic complications: no       Last Vitals:  Vitals:   01/25/17 0930 01/25/17 0947  BP: (!) 158/94 (!) 142/86  Pulse: 78 83  Resp: 13 16  Temp: 36.9 C 36.4 C    Last Pain:  Vitals:   01/25/17 0947  TempSrc:   PainSc: 4                  [name] S

## 2017-01-25 NOTE — Interval H&P Note (Signed)
History and Physical Interval Note:  01/25/2017 6:37 AM  Scott Turner  has presented today for surgery, with the diagnosis of umbilical hernia  The various methods of treatment have been discussed with the patient and family. After consideration of risks, benefits and other options for treatment, the patient has consented to  Procedure(s): HERNIA REPAIR UMBILICAL ADULT (N/A) INSERTION OF MESH (N/A) as a surgical intervention .  The patient's history has been reviewed, patient examined, no change in status, stable for surgery.  I have reviewed the patient's chart and labs.  Questions were answered to the patient's satisfaction.     Scott Turner

## 2017-01-25 NOTE — Op Note (Signed)
Operative Note  Scott Turner  PH:[id number]  [id number]  01/25/2017   Surgeon: Clovis Riley  Assistant: none  Procedure performed: primary umbilical hernia repair   Preop diagnosis: incarcerated umbilical hernia Post-op diagnosis/intraop findings: same  Specimens: none Retained items: none EBL: [id number] Complications: none  Description of procedure: After obtaining informed consent the patient was taken to the operating room and placed supine on operating room table wheregeneral endotracheal anesthesia was initiated, preoperative antibiotics were administered, SCDs applied, and a formal timeout was performed. The abdomen was prepped and draped in the usual sterile fashion and a curvilinear incision was made just under the umbilicus. The soft tissues were dissected with cautery and blunt dissection until the umbilical stalk could be transected and the hernia sac seperated from the overlying skin. The sac was opened and confirmed incarcerated omentum. The sac and incarcerated omentum were excised with cautery. The fascial defect was about 25mm in diameter and this was reapproximated with interruped 0-ethibonds. Hemostasis within the wound was ensured with cautery. The umbilical skin was tacked back down to the fascia with a 3-0 vicryl.The skin was closed with running subcuticular monocryl and liquiban followed by a pressure dressing. The patient was then awakened, extubated and taken to PACU in stable condition.   All counts were correct at the completion of the case.

## 2017-01-25 NOTE — Transfer of Care (Signed)
Immediate Anesthesia Transfer of Care Note  Patient: Scott Turner  Procedure(s) Performed: Procedure(s): HERNIA REPAIR UMBILICAL ADULT (N/A)  Patient Location: PACU  Anesthesia Type:General  Level of Consciousness: awake and alert   Airway & Oxygen Therapy: Patient Spontanous Breathing and Patient connected to face mask oxygen  Post-op Assessment: Report given to RN and Post -op Vital signs reviewed and stable  Post vital signs: Reviewed and stable  Last Vitals:  Vitals:   01/25/17 0540  BP: (!) 156/96  Pulse: 79  Resp: 18  Temp: 36.4 C    Last Pain:  Vitals:   01/25/17 0540  TempSrc: Oral      Patients Stated Pain Goal: 1 ([id number])  Complications: No apparent anesthesia complications

## 2017-01-26 ENCOUNTER — Encounter (HOSPITAL_COMMUNITY): Payer: Self-pay | Admitting: Surgery

## 2017-05-30 NOTE — Addendum Note (Signed)
Addendum  created 05/30/17 1037 by Myrtie Soman, MD   Sign clinical note

## 2018-10-05 ENCOUNTER — Ambulatory Visit: Payer: BLUE CROSS/BLUE SHIELD | Admitting: Family Medicine

## 2018-10-05 ENCOUNTER — Encounter: Payer: Self-pay | Admitting: Family Medicine

## 2018-10-05 VITALS — BP 130/90 | HR 87 | Temp 98.2°F | Ht 72.0 in | Wt 271.8 lb

## 2018-10-05 DIAGNOSIS — L719 Rosacea, unspecified: Secondary | ICD-10-CM

## 2018-10-05 DIAGNOSIS — D485 Neoplasm of uncertain behavior of skin: Secondary | ICD-10-CM

## 2018-10-05 DIAGNOSIS — L918 Other hypertrophic disorders of the skin: Secondary | ICD-10-CM

## 2018-10-05 MED ORDER — AZELAIC ACID 15 % EX GEL: CUTANEOUS | 0 refills | Status: DC

## 2018-10-05 NOTE — Assessment & Plan Note (Signed)
-  Large pedunculated skin tag to L shoulder as well as several smaller areas to back/underarm area.  -He will discuss removal of these with dermatology.

## 2018-10-05 NOTE — Assessment & Plan Note (Addendum)
-  Area to R posterior shoulder concerning for [facility], referral placed to dermatology -Areas on arm consistent with SK, will monitor.

## 2018-10-05 NOTE — Assessment & Plan Note (Signed)
-  Trial of azelaic acid

## 2018-10-05 NOTE — Progress Notes (Signed)
Scott Turner - 42 y.o. male MRN [id number]  Date of birth: 1976/10/05  Subjective Chief Complaint  Patient presents with  . Cyst    HPI Scott Turner is a 42 y.o. male with history of BCC here to discuss several skin issues today.   -Skin lesions:  Has several skin lesions he is concerned about to forearm and back/ R shoulder area.  Areas on arm are pigmented, slightly raised and scaly in appearance.  Has had for several months, no change in size.  Areas on back are reddened and itch at times.  Denies bleeding.  Has had BCC removed from L neck area previously.   -Cyst:  Has what he believes may be a cyst on his L shoulder.  Has been present for several months.  Has changed in size.  Denies associated pain, swelling or drainage from this area.   -Facial redness:  Reports recurrent facial redness and occasional flushing. Has been going on for a few years.  This is often accompanied by pustules on the forehead area.  Denies any ocular symptoms. Has not tried anything for this.   ROS:  A comprehensive ROS was completed and negative except as noted per HPI  Allergies  Allergen Reactions  . Sulfa Antibiotics     Childhood allergy     Past Medical History:  Diagnosis Date  . Allergy   . Bronchitis    approx 1 month ago   . Cancer ([place])    basal cell carcinoma on neck    Past Surgical History:  Procedure Laterality Date  . bsal cell carcinoma removed from neck     . EYE SURGERY     lasik surgery bilateral   . TONSILLECTOMY    . UMBILICAL HERNIA REPAIR N/A 01/25/2017   Procedure: HERNIA REPAIR UMBILICAL ADULT;  Surgeon: Clovis Riley, MD;  Location: [facility];  Service: General;  Laterality: N/A;    Social History   Socioeconomic History  . Marital status: Married    Spouse name: Not on file  . Number of children: Not on file  . Years of education: Not on file  . Highest education level: Not on file  Occupational History  . Not on file  Social Needs  . Financial resource  strain: Not on file  . Food insecurity:    Worry: Not on file    Inability: Not on file  . Transportation needs:    Medical: Not on file    Non-medical: Not on file  Tobacco Use  . Smoking status: Former Smoker    Packs/day: 1.00    Years: 10.00    Pack years: 10.00    Types: Cigarettes    Last attempt to quit: 09/19/2011    Years since quitting: 7.0  . Smokeless tobacco: Never Used  Substance and Sexual Activity  . Alcohol use: Yes    Comment: maybe 6 pack on weekends   . Drug use: No  . Sexual activity: Yes  Lifestyle  . Physical activity:    Days per week: Not on file    Minutes per session: Not on file  . Stress: Not on file  Relationships  . Social connections:    Talks on phone: Not on file    Gets together: Not on file    Attends religious service: Not on file    Active member of club or organization: Not on file    Attends meetings of clubs or organizations: Not on file  Relationship status: Not on file  Other Topics Concern  . Not on file  Social History Narrative  . Not on file    Family History  Problem Relation Age of Onset  . Heart disease Mother   . Cancer Maternal Grandmother   . Cancer Maternal Grandfather   . Heart disease Paternal Grandmother   . Heart disease Paternal Grandfather     Health Maintenance  Topic Date Due  . Samul Dada  07/10/1996  . INFLUENZA VACCINE  10/03/2019 (Originally 07/27/2018)  . HIV Screening  10/06/2019 (Originally 07/10/1992)    ----------------------------------------------------------------------------------------------------------------------------------------------------------------------------------------------------------------- Physical Exam BP 130/90 (BP Location: Right Arm, Patient Position: Sitting, Cuff Size: Large)   Pulse 87   Temp 98.2 F (36.8 C)   Ht 6' (1.829 m)   Wt 271 lb 12.8 oz (123.3 kg)   SpO2 98%   BMI 36.86 kg/m   Physical Exam  Constitutional: He is oriented to person, place,  and time. He appears well-nourished. No distress.  HENT:  Head: Normocephalic and atraumatic.  Eyes: No scleral icterus.  Cardiovascular: Normal rate, regular rhythm and normal heart sounds.  Neurological: He is alert and oriented to person, place, and time.  Skin:  Erythematous, slightly raised lesion or R posterior shoulder.  No ulceration noted.   Several light brown pigmented, scaly, slightly raised macules on forearms.   Pedunculated mass to L shoulder.  Non-tender without fluctuance.   Facial erythema on cheeks and forehead with a few scattered pustules on forehead    Psychiatric: He has a normal mood and affect. His behavior is normal.    ------------------------------------------------------------------------------------------------------------------------------------------------------------------------------------------------------------------- Assessment and Plan  Neoplasm of uncertain behavior of skin -Area to R posterior shoulder concerning for [facility], referral placed to dermatology -Areas on arm consistent with SK, will monitor.    Acrochordon -Large pedunculated skin tag to L shoulder as well as several smaller areas to back/underarm area.  -He will discuss removal of these with dermatology.   Rosacea -Trial of azelaic acid

## 2018-10-05 NOTE — Patient Instructions (Signed)
Try azelaic acid for rosacea.  You may notice a mild burning/stinging sensation at first but this should improve after using for the first couple of weeks.  I have placed a referral for dermatology as well, you will be contacted for this appt.

## 2018-10-10 DIAGNOSIS — L718 Other rosacea: Secondary | ICD-10-CM | POA: Diagnosis not present

## 2018-10-10 DIAGNOSIS — L509 Urticaria, unspecified: Secondary | ICD-10-CM | POA: Diagnosis not present

## 2018-10-10 DIAGNOSIS — D485 Neoplasm of uncertain behavior of skin: Secondary | ICD-10-CM | POA: Diagnosis not present

## 2018-10-17 DIAGNOSIS — D2362 Other benign neoplasm of skin of left upper limb, including shoulder: Secondary | ICD-10-CM | POA: Diagnosis not present

## 2018-10-19 DIAGNOSIS — D219 Benign neoplasm of connective and other soft tissue, unspecified: Secondary | ICD-10-CM | POA: Diagnosis not present

## 2018-11-22 DIAGNOSIS — D219 Benign neoplasm of connective and other soft tissue, unspecified: Secondary | ICD-10-CM | POA: Diagnosis not present

## 2018-11-22 DIAGNOSIS — B078 Other viral warts: Secondary | ICD-10-CM | POA: Diagnosis not present

## 2018-11-22 DIAGNOSIS — L570 Actinic keratosis: Secondary | ICD-10-CM | POA: Diagnosis not present

## 2018-11-22 DIAGNOSIS — L918 Other hypertrophic disorders of the skin: Secondary | ICD-10-CM | POA: Diagnosis not present

## 2019-02-26 DIAGNOSIS — M79671 Pain in right foot: Secondary | ICD-10-CM | POA: Diagnosis not present

## 2019-02-26 DIAGNOSIS — M2041 Other hammer toe(s) (acquired), right foot: Secondary | ICD-10-CM | POA: Diagnosis not present

## 2019-02-26 DIAGNOSIS — M722 Plantar fascial fibromatosis: Secondary | ICD-10-CM | POA: Diagnosis not present

## 2019-02-26 DIAGNOSIS — M71571 Other bursitis, not elsewhere classified, right ankle and foot: Secondary | ICD-10-CM | POA: Diagnosis not present

## 2019-03-15 DIAGNOSIS — M71571 Other bursitis, not elsewhere classified, right ankle and foot: Secondary | ICD-10-CM | POA: Diagnosis not present

## 2019-03-15 DIAGNOSIS — M722 Plantar fascial fibromatosis: Secondary | ICD-10-CM | POA: Diagnosis not present

## 2019-05-17 DIAGNOSIS — L700 Acne vulgaris: Secondary | ICD-10-CM | POA: Diagnosis not present

## 2019-05-17 DIAGNOSIS — D225 Melanocytic nevi of trunk: Secondary | ICD-10-CM | POA: Diagnosis not present

## 2019-05-17 DIAGNOSIS — B356 Tinea cruris: Secondary | ICD-10-CM | POA: Diagnosis not present

## 2019-05-17 DIAGNOSIS — B353 Tinea pedis: Secondary | ICD-10-CM | POA: Diagnosis not present

## 2019-05-17 DIAGNOSIS — L820 Inflamed seborrheic keratosis: Secondary | ICD-10-CM | POA: Diagnosis not present

## 2019-06-26 ENCOUNTER — Telehealth: Payer: Self-pay

## 2019-06-26 NOTE — Telephone Encounter (Signed)
Called Pt to inquire how he is feeling and if he wanted to make a virtual visit with Dr. Zigmund Daniel. LVM to return call . CRM created.

## 2019-06-28 ENCOUNTER — Telehealth: Payer: Self-pay

## 2019-06-28 NOTE — Telephone Encounter (Signed)
Questions for Screening COVID-19  Symptom onset: none Travel or Contacts: none  During this illness, did/does the patient experience any of the following symptoms? Fever >100.88F []   Yes [x]   No []   Unknown Subjective fever (felt feverish) []   Yes [x]   No []   Unknown Chills []   Yes [x]   No []   Unknown Muscle aches (myalgia) []   Yes [x]   No []   Unknown Runny nose (rhinorrhea) []   Yes [x]   No []   Unknown Sore throat []   Yes [x]   No []   Unknown Cough (new onset or worsening of chronic cough) []   Yes [x]   No []   Unknown Shortness of breath (dyspnea) []   Yes [x]   No []   Unknown Nausea or vomiting []   Yes [x]   No []   Unknown Headache []   Yes [x]   No []   Unknown Abdominal pain  []   Yes [x]   No []   Unknown Diarrhea (?3 loose/looser than normal stools/24hr period) []   Yes [x]   No []   Unknown Other, specify:  Patient risk factors: Smoker? []   Current []   Former [x]   Never If male, currently pregnant? []   Yes [x]   No  Patient Active Problem List   Diagnosis Date Noted  . Neoplasm of uncertain behavior of skin 10/05/2018  . Acrochordon 10/05/2018  . Rosacea 10/05/2018    Plan:  []   High risk for COVID-19 with red flags go to ED (with CP, SOB, weak/lightheaded, or fever > 101.5). Call ahead.  []   High risk for COVID-19 but stable. Inform provider and coordinate time for [facility] visit.   [x]   No red flags but URI signs or symptoms okay for [facility] visit.

## 2019-07-02 ENCOUNTER — Ambulatory Visit: Payer: BC Managed Care – PPO | Admitting: Family Medicine

## 2019-07-02 ENCOUNTER — Encounter: Payer: Self-pay | Admitting: Family Medicine

## 2019-07-02 VITALS — BP 160/100 | HR 72 | Temp 98.9°F | Resp 18 | Ht 72.0 in | Wt 265.0 lb

## 2019-07-02 DIAGNOSIS — R519 Headache, unspecified: Secondary | ICD-10-CM | POA: Insufficient documentation

## 2019-07-02 DIAGNOSIS — R030 Elevated blood-pressure reading, without diagnosis of hypertension: Secondary | ICD-10-CM | POA: Diagnosis not present

## 2019-07-02 DIAGNOSIS — R51 Headache: Secondary | ICD-10-CM

## 2019-07-02 HISTORY — DX: Elevated blood-pressure reading, without diagnosis of hypertension: R03.0

## 2019-07-02 MED ORDER — KETOROLAC TROMETHAMINE 60 MG/2ML IM SOLN
60.0000 mg | Freq: Once | INTRAMUSCULAR | Status: AC
  Administered 2019-07-02: 60 mg via INTRAMUSCULAR

## 2019-07-02 MED ORDER — DEXAMETHASONE SODIUM PHOSPHATE 10 MG/ML IJ SOLN
10.0000 mg | Freq: Once | INTRAMUSCULAR | Status: AC
  Administered 2019-07-02: 10 mg via INTRAMUSCULAR

## 2019-07-02 MED ORDER — AMLODIPINE BESYLATE 5 MG PO TABS: 5.0000 mg | ORAL_TABLET | Freq: Every day | ORAL | 0 refills | Status: DC

## 2019-07-02 NOTE — Assessment & Plan Note (Signed)
-  Likely related to elevated BP.  -Neuro exam reassuring.   -Injection of toradol and dexamethasone given today.  -Discussed red flags.

## 2019-07-02 NOTE — Assessment & Plan Note (Signed)
BP elevated today, even with repeat testing.  Discussed reduction in sodium intake.  Will start amlodipine 5mg  daily.  F/u 2 weeks.

## 2019-07-02 NOTE — Patient Instructions (Addendum)
DASH Eating Plan DASH stands for "Dietary Approaches to Stop Hypertension." The DASH eating plan is a healthy eating plan that has been shown to reduce high blood pressure (hypertension). It may also reduce your risk for type 2 diabetes, heart disease, and stroke. The DASH eating plan may also help with weight loss. What are tips for following this plan?  General guidelines  Avoid eating more than 2,300 mg (milligrams) of salt (sodium) a day. If you have hypertension, you may need to reduce your sodium intake to 1,500 mg a day.  Limit alcohol intake to no more than 1 drink a day for nonpregnant women and 2 drinks a day for men. One drink equals 12 oz of beer, 5 oz of wine, or 1 oz of hard liquor.  Work with your health care provider to maintain a healthy body weight or to lose weight. Ask what an ideal weight is for you.  Get at least 30 minutes of exercise that causes your heart to beat faster (aerobic exercise) most days of the week. Activities may include walking, swimming, or biking.  Work with your health care provider or diet and nutrition specialist (dietitian) to adjust your eating plan to your individual calorie needs. Reading food labels   Check food labels for the amount of sodium per serving. Choose foods with less than 5 percent of the Daily Value of sodium. Generally, foods with less than 300 mg of sodium per serving fit into this eating plan.  To find whole grains, look for the word "whole" as the first word in the ingredient list. Shopping  Buy products labeled as "low-sodium" or "no salt added."  Buy fresh foods. Avoid canned foods and premade or frozen meals. Cooking  Avoid adding salt when cooking. Use salt-free seasonings or herbs instead of table salt or sea salt. Check with your health care provider or pharmacist before using salt substitutes.  Do not fry foods. Cook foods using healthy methods such as baking, boiling, grilling, and broiling instead.  Cook with  heart-healthy oils, such as olive, canola, soybean, or sunflower oil. Meal planning  Eat a balanced diet that includes: ? 5 or more servings of fruits and vegetables each day. At each meal, try to fill half of your plate with fruits and vegetables. ? Up to 6-8 servings of whole grains each day. ? Less than 6 oz of lean meat, poultry, or fish each day. A 3-oz serving of meat is about the same size as a deck of cards. One egg equals 1 oz. ? 2 servings of low-fat dairy each day. ? A serving of nuts, seeds, or beans 5 times each week. ? Heart-healthy fats. Healthy fats called Omega-3 fatty acids are found in foods such as flaxseeds and coldwater fish, like sardines, salmon, and mackerel.  Limit how much you eat of the following: ? Canned or prepackaged foods. ? Food that is high in trans fat, such as fried foods. ? Food that is high in saturated fat, such as fatty meat. ? Sweets, desserts, sugary drinks, and other foods with added sugar. ? Full-fat dairy products.  Do not salt foods before eating.  Try to eat at least 2 vegetarian meals each week.  Eat more home-cooked food and less restaurant, buffet, and fast food.  When eating at a restaurant, ask that your food be prepared with less salt or no salt, if possible. What foods are recommended? The items listed may not be a complete list. Talk with your dietitian about   what dietary choices are best for you. Grains Whole-grain or whole-wheat bread. Whole-grain or whole-wheat pasta. Brown rice. Oatmeal. Quinoa. Bulgur. Whole-grain and low-sodium cereals. Pita bread. Low-fat, low-sodium crackers. Whole-wheat flour tortillas. Vegetables Fresh or frozen vegetables (raw, steamed, roasted, or grilled). Low-sodium or reduced-sodium tomato and vegetable juice. Low-sodium or reduced-sodium tomato sauce and tomato paste. Low-sodium or reduced-sodium canned vegetables. Fruits All fresh, dried, or frozen fruit. Canned fruit in natural juice (without  added sugar). Meat and other protein foods Skinless chicken or turkey. Ground chicken or turkey. Pork with fat trimmed off. Fish and seafood. Egg whites. Dried beans, peas, or lentils. Unsalted nuts, nut butters, and seeds. Unsalted canned beans. Lean cuts of beef with fat trimmed off. Low-sodium, lean deli meat. Dairy Low-fat (1%) or fat-free (skim) milk. Fat-free, low-fat, or reduced-fat cheeses. Nonfat, low-sodium ricotta or cottage cheese. Low-fat or nonfat yogurt. Low-fat, low-sodium cheese. Fats and oils Soft margarine without trans fats. Vegetable oil. Low-fat, reduced-fat, or light mayonnaise and salad dressings (reduced-sodium). Canola, safflower, olive, soybean, and sunflower oils. Avocado. Seasoning and other foods Herbs. Spices. Seasoning mixes without salt. Unsalted popcorn and pretzels. Fat-free sweets. What foods are not recommended? The items listed may not be a complete list. Talk with your dietitian about what dietary choices are best for you. Grains Baked goods made with fat, such as croissants, muffins, or some breads. Dry pasta or rice meal packs. Vegetables Creamed or fried vegetables. Vegetables in a cheese sauce. Regular canned vegetables (not low-sodium or reduced-sodium). Regular canned tomato sauce and paste (not low-sodium or reduced-sodium). Regular tomato and vegetable juice (not low-sodium or reduced-sodium). Pickles. Olives. Fruits Canned fruit in a light or heavy syrup. Fried fruit. Fruit in cream or butter sauce. Meat and other protein foods Fatty cuts of meat. Ribs. Fried meat. Bacon. Sausage. Bologna and other processed lunch meats. Salami. Fatback. Hotdogs. Bratwurst. Salted nuts and seeds. Canned beans with added salt. Canned or smoked fish. Whole eggs or egg yolks. Chicken or turkey with skin. Dairy Whole or 2% milk, cream, and half-and-half. Whole or full-fat cream cheese. Whole-fat or sweetened yogurt. Full-fat cheese. Nondairy creamers. Whipped toppings.  Processed cheese and cheese spreads. Fats and oils Butter. Stick margarine. Lard. Shortening. Ghee. Bacon fat. Tropical oils, such as coconut, palm kernel, or palm oil. Seasoning and other foods Salted popcorn and pretzels. Onion salt, garlic salt, seasoned salt, table salt, and sea salt. Worcestershire sauce. Tartar sauce. Barbecue sauce. Teriyaki sauce. Soy sauce, including reduced-sodium. Steak sauce. Canned and packaged gravies. Fish sauce. Oyster sauce. Cocktail sauce. Horseradish that you find on the shelf. Ketchup. Mustard. Meat flavorings and tenderizers. Bouillon cubes. Hot sauce and Tabasco sauce. Premade or packaged marinades. Premade or packaged taco seasonings. Relishes. Regular salad dressings. Where to find more information:  National Heart, Lung, and Blood Institute: www.nhlbi.nih.gov  American Heart Association: www.heart.org Summary  The DASH eating plan is a healthy eating plan that has been shown to reduce high blood pressure (hypertension). It may also reduce your risk for type 2 diabetes, heart disease, and stroke.  With the DASH eating plan, you should limit salt (sodium) intake to 2,300 mg a day. If you have hypertension, you may need to reduce your sodium intake to 1,500 mg a day.  When on the DASH eating plan, aim to eat more fresh fruits and vegetables, whole grains, lean proteins, low-fat dairy, and heart-healthy fats.  Work with your health care provider or diet and nutrition specialist (dietitian) to adjust your eating plan to your   individual calorie needs. This information is not intended to replace advice given to you by your health care provider. Make sure you discuss any questions you have with your health care provider. Document Released: 12/02/2011 Document Revised: 11/25/2017 Document Reviewed: 12/06/2016 Elsevier Patient Education  [place] Headache  A headache is pain or discomfort that is felt around the head or neck area.  There are many causes and types of headaches. In some cases, the cause may not be found. Follow these instructions at home: Watch your condition for any changes. Let your doctor know about them. Take these steps to help with your condition: Managing pain      Take over-the-counter and prescription medicines only as told by your doctor.  Lie down in a dark, quiet room when you have a headache.  If told, put ice on your head and neck area: ? Put ice in a plastic bag. ? Place a towel between your skin and the bag. ? Leave the ice on for 20 minutes, 2-3 times per day.  If told, put heat on the affected area. Use the heat source that your doctor recommends, such as a moist heat pack or a heating pad. ? Place a towel between your skin and the heat source. ? Leave the heat on for 20-30 minutes. ? Remove the heat if your skin turns bright red. This is very important if you are unable to feel pain, heat, or cold. You may have a greater risk of getting burned.  Keep lights dim if bright lights bother you or make your headaches worse. Eating and drinking  Eat meals on a regular schedule.  If you drink alcohol: ? Limit how much you use to:  0-1 drink a day for women.  0-2 drinks a day for men. ? Be aware of how much alcohol is in your drink. In the [place] one drink equals one 12 oz bottle of beer (355 mL), one 5 oz glass of wine (148 mL), or one 1 oz glass of hard liquor (44 mL).  Stop drinking caffeine, or reduce how much caffeine you drink. General instructions   Keep a journal to find out if certain things bring on headaches. For example, write down: ? What you eat and drink. ? How much sleep you get. ? Any change to your diet or medicines.  Get a massage or try other ways to relax.  Limit stress.  Sit up straight. Do not tighten (tense) your muscles.  Do not use any products that contain nicotine or tobacco. This includes cigarettes, e-cigarettes, and chewing tobacco. If you  need help quitting, ask your doctor.  Exercise regularly as told by your doctor.  Get enough sleep. This often means 7-9 hours of sleep each night.  Keep all follow-up visits as told by your doctor. This is important. Contact a doctor if:  Your symptoms are not helped by medicine.  You have a headache that feels different than the other headaches.  You feel sick to your stomach (nauseous) or you throw up (vomit).  You have a fever. Get help right away if:  Your headache gets very bad quickly.  Your headache gets worse after a lot of physical activity.  You keep throwing up.  You have a stiff neck.  You have trouble seeing.  You have trouble speaking.  You have pain in the eye or ear.  Your muscles are weak or you lose muscle control.  You lose your  balance or have trouble walking.  You feel like you will pass out (faint) or you pass out.  You are mixed up (confused).  You have a seizure. Summary  A headache is pain or discomfort that is felt around the head or neck area.  There are many causes and types of headaches. In some cases, the cause may not be found.  Keep a journal to help find out what causes your headaches. Watch your condition for any changes. Let your doctor know about them.  Contact a doctor if you have a headache that is different from usual, or if your headache is not helped by medicine.  Get help right away if your headache gets very bad, you throw up, you have trouble seeing, you lose your balance, or you have a seizure. This information is not intended to replace advice given to you by your health care provider. Make sure you discuss any questions you have with your health care provider. Document Released: 09/21/2008 Document Revised: 07/03/2018 Document Reviewed: 07/03/2018 Elsevier Patient Education  [place].    Form - Headache Record There are many types and causes of headaches. A headache record can help guide your  treatment plan. Use this form to record the details. Bring this form with you to your follow-up visits. Follow your health care provider's instructions on how to describe your headache. You may be asked to:  Use a pain scale. This is a tool to rate the intensity of your headache using words or numbers.  Describe what your headache feels like, such as dull, achy, throbbing, or sharp. Headache record Date: _______________ Time (from start to end): ____________________ Location of the headache: _________________________  Intensity of the headache: ____________________ Description of the headache: ______________________________________________________________  Hours of sleep the night before the headache: __________  Food or drinks before the headache started: ______________________________________________________________________________________  Events before the headache started: _______________________________________________________________________________________________  Symptoms before the headache started: __________________________________________________________________________________________  Symptoms during the headache: __________________________________________________________________________________________________  Treatment: ________________________________________________________________________________________________________________  Effect of treatment: _________________________________________________________________________________________________________  Other comments: ___________________________________________________________________________________________________________ Date: _______________ Time (from start to end): ____________________ Location of the headache: _________________________  Intensity of the headache: ____________________ Description of the headache: ______________________________________________________________  Hours of sleep the night before  the headache: __________  Food or drinks before the headache started: ______________________________________________________________________________________  Events before the headache started: ____________________________________________________________________________________________  Symptoms before the headache started: _________________________________________________________________________________________  Symptoms during the headache: _______________________________________________________________________________________________  Treatment: ________________________________________________________________________________________________________________  Effect of treatment: _________________________________________________________________________________________________________  Other comments: ___________________________________________________________________________________________________________ Date: _______________ Time (from start to end): ____________________ Location of the headache: _________________________  Intensity of the headache: ____________________ Description of the headache: ______________________________________________________________  Hours of sleep the night before the headache: __________  Food or drinks before the headache started: ______________________________________________________________________________________  Events before the headache started: ____________________________________________________________________________________________  Symptoms before the headache started: _________________________________________________________________________________________  Symptoms during the headache: _______________________________________________________________________________________________  Treatment: ________________________________________________________________________________________________________________  Effect of treatment:  _________________________________________________________________________________________________________  Other comments: ___________________________________________________________________________________________________________ Date: _______________ Time (from start to end): ____________________ Location of the headache: _________________________  Intensity of the headache: ____________________ Description of the headache: ______________________________________________________________  Hours of sleep the night before the headache: _________  Food or drinks before the headache started: ______________________________________________________________________________________  Events before the headache started: ____________________________________________________________________________________________  Symptoms before the headache started: _________________________________________________________________________________________  Symptoms during the headache: _______________________________________________________________________________________________  Treatment: ________________________________________________________________________________________________________________  Effect of treatment: _________________________________________________________________________________________________________  Other comments: ___________________________________________________________________________________________________________ Date: _______________ Time (from start to end): ____________________ Location of the headache: _________________________  Intensity of the headache: ____________________ Description of  the headache: ______________________________________________________________  Hours of sleep the night before the headache: _________  Food or drinks before the headache started: ______________________________________________________________________________________  Events before the  headache started: ____________________________________________________________________________________________  Symptoms before the headache started: _________________________________________________________________________________________  Symptoms during the headache: _______________________________________________________________________________________________  Treatment: ________________________________________________________________________________________________________________  Effect of treatment: _________________________________________________________________________________________________________  Other comments: ___________________________________________________________________________________________________________ This information is not intended to replace advice given to you by your health care provider. Make sure you discuss any questions you have with your health care provider. Document Released: 01/01/2019 Document Revised: 01/01/2019 Document Reviewed: 01/01/2019 Elsevier Patient Education  [place].

## 2019-07-02 NOTE — Progress Notes (Signed)
Scott Turner - 43 y.o. male MRN [id number]  Date of birth: 11/21/1976  Subjective Chief Complaint  Patient presents with  . Headache    Onset 2 wks , R temproal episodic, FaHx HTN/HDL     HPI Scott Turner is a 43 y.o. male here today with complaint of headache.  Reports headache started about 2 weeks ago and has waxed and waned since that time.  Significantly worse when waking up after drinking a few beers a few days ago.  Pain located in the front and R side of head.  Denies jaw pain/claudication, vision changes, eye watering, numbness, tingling, weakness, slurred speech, tinnitus, dizziness, nausea or light sensitivity.  He has had some response to ibuprofen.   He is trying to increase his fluid intake.    ROS:  A comprehensive ROS was completed and negative except as noted per HPI  Allergies  Allergen Reactions  . Sulfa Antibiotics     Childhood allergy     Past Medical History:  Diagnosis Date  . Allergy   . Bronchitis    approx 1 month ago   . Cancer ([place])    basal cell carcinoma on neck    Past Surgical History:  Procedure Laterality Date  . bsal cell carcinoma removed from neck     . EYE SURGERY     lasik surgery bilateral   . TONSILLECTOMY    . UMBILICAL HERNIA REPAIR N/A 01/25/2017   Procedure: HERNIA REPAIR UMBILICAL ADULT;  Surgeon: Clovis Riley, MD;  Location: [facility];  Service: General;  Laterality: N/A;    Social History   Socioeconomic History  . Marital status: Married    Spouse name: Not on file  . Number of children: Not on file  . Years of education: Not on file  . Highest education level: Not on file  Occupational History  . Not on file  Social Needs  . Financial resource strain: Not on file  . Food insecurity    Worry: Not on file    Inability: Not on file  . Transportation needs    Medical: Not on file    Non-medical: Not on file  Tobacco Use  . Smoking status: Former Smoker    Packs/day: 1.00    Years: 10.00    Pack years: 10.00     Types: Cigarettes    Quit date: 09/19/2011    Years since quitting: 7.7  . Smokeless tobacco: Never Used  Substance and Sexual Activity  . Alcohol use: Yes    Comment: maybe 6 pack on weekends   . Drug use: No  . Sexual activity: Yes  Lifestyle  . Physical activity    Days per week: Not on file    Minutes per session: Not on file  . Stress: Not on file  Relationships  . Social Herbalist on phone: Not on file    Gets together: Not on file    Attends religious service: Not on file    Active member of club or organization: Not on file    Attends meetings of clubs or organizations: Not on file    Relationship status: Not on file  Other Topics Concern  . Not on file  Social History Narrative  . Not on file    Family History  Problem Relation Age of Onset  . Heart disease Mother   . Cancer Maternal Grandmother   . Cancer Maternal Grandfather   . Heart disease Paternal Grandmother   .  Heart disease Paternal Grandfather     Health Maintenance  Topic Date Due  . Samul Dada  07/10/1996  . HIV Screening  10/06/2019 (Originally 07/10/1992)  . INFLUENZA VACCINE  07/28/2019    ----------------------------------------------------------------------------------------------------------------------------------------------------------------------------------------------------------------- Physical Exam BP (!) 160/100 (BP Location: Right Arm, Patient Position: Sitting, Cuff Size: Large)   Pulse 72   Temp 98.9 F (37.2 C) (Oral)   Resp 18   Ht 6' (1.829 m)   Wt 265 lb (120.2 kg)   SpO2 99%   BMI 35.94 kg/m   Physical Exam Constitutional:      Appearance: He is well-developed. He is obese. He is not ill-appearing.  HENT:     Head: Normocephalic and atraumatic.     Nose: Nose normal.  Eyes:     General: No scleral icterus. Neck:     Musculoskeletal: Neck supple.  Cardiovascular:     Rate and Rhythm: Normal rate and regular rhythm.  Pulmonary:     Effort:  Pulmonary effort is normal.     Breath sounds: Normal breath sounds.  Musculoskeletal: Normal range of motion.     Right lower leg: No edema.     Left lower leg: No edema.  Skin:    General: Skin is warm and dry.  Neurological:     General: No focal deficit present.     Mental Status: He is alert.     Cranial Nerves: No cranial nerve deficit.     Sensory: No sensory deficit.     Motor: No weakness.     Coordination: Coordination normal.     Gait: Gait normal.  Psychiatric:        Mood and Affect: Mood normal.        Behavior: Behavior normal.     ------------------------------------------------------------------------------------------------------------------------------------------------------------------------------------------------------------------- Assessment and Plan  Elevated blood pressure reading BP elevated today, even with repeat testing.  Discussed reduction in sodium intake.  Will start amlodipine 5mg  daily.  F/u 2 weeks.   Acute intractable headache -Likely related to elevated BP.  -Neuro exam reassuring.   -Injection of toradol and dexamethasone given today.  -Discussed red flags.

## 2019-07-13 ENCOUNTER — Telehealth: Payer: Self-pay

## 2019-07-13 NOTE — Telephone Encounter (Signed)
Questions for Screening COVID-19  Symptom onset: No, Pt repeated #2046 Ph # for arrival   Travel or Contacts: none  During this illness, did/does the patient experience any of the following symptoms? Fever >100.59F []   Yes [x]   No []   Unknown Subjective fever (felt feverish) []   Yes [x]   No []   Unknown Chills []   Yes [x]   No []   Unknown Muscle aches (myalgia) []   Yes [x]   No []   Unknown Runny nose (rhinorrhea) []   Yes [x]   No []   Unknown Sore throat []   Yes [x]   No []   Unknown Cough (new onset or worsening of chronic cough) []   Yes [x]   No []   Unknown Shortness of breath (dyspnea) []   Yes [x]   No []   Unknown Nausea or vomiting []   Yes [x]   No []   Unknown Headache []   Yes []   No [x]   Unknown Abdominal pain  []   Yes [x]   No []   Unknown Diarrhea (?3 loose/looser than normal stools/24hr period) []   Yes [x]   No []   Unknown Other, specify:  Patient risk factors: Smoker? []   Current []   Former [x]   Never If male, currently pregnant? []   Yes [x]   No  Patient Active Problem List   Diagnosis Date Noted  . Elevated blood pressure reading 07/02/2019  . Acute intractable headache 07/02/2019  . Neoplasm of uncertain behavior of skin 10/05/2018  . Acrochordon 10/05/2018  . Rosacea 10/05/2018    Plan:  []   High risk for COVID-19 with red flags go to ED (with CP, SOB, weak/lightheaded, or fever > 101.5). Call ahead.  []   High risk for COVID-19 but stable. Inform provider and coordinate time for [facility] visit.   [x]   No red flags but URI signs or symptoms okay for [facility] visit.

## 2019-07-16 ENCOUNTER — Ambulatory Visit: Payer: BC Managed Care – PPO | Admitting: Family Medicine

## 2019-07-16 ENCOUNTER — Encounter: Payer: Self-pay | Admitting: Family Medicine

## 2019-07-16 VITALS — BP 138/84 | HR 78 | Temp 98.1°F | Resp 16 | Ht 72.0 in | Wt 267.0 lb

## 2019-07-16 DIAGNOSIS — R351 Nocturia: Secondary | ICD-10-CM | POA: Diagnosis not present

## 2019-07-16 DIAGNOSIS — I10 Essential (primary) hypertension: Secondary | ICD-10-CM

## 2019-07-16 LAB — POCT URINALYSIS DIPSTICK
Bilirubin, UA: NEGATIVE
Blood, UA: NEGATIVE
Glucose, UA: NEGATIVE
Ketones, UA: NEGATIVE
Leukocytes, UA: NEGATIVE
Nitrite, UA: NEGATIVE
Protein, UA: NEGATIVE
Spec Grav, UA: 1.02 (ref 1.010–1.025)
Urobilinogen, UA: 0.2 E.U./dL
pH, UA: 6 (ref 5.0–8.0)

## 2019-07-16 LAB — COMPREHENSIVE METABOLIC PANEL
ALT: 61 U/L — ABNORMAL HIGH (ref 0–53)
AST: 24 U/L (ref 0–37)
Albumin: 4.2 g/dL (ref 3.5–5.2)
Alkaline Phosphatase: 105 U/L (ref 39–117)
BUN: 9 mg/dL (ref 6–23)
CO2: 24 mEq/L (ref 19–32)
Calcium: 9.1 mg/dL (ref 8.4–10.5)
Chloride: 104 mEq/L (ref 96–112)
Creatinine, Ser: 0.82 mg/dL (ref 0.40–1.50)
GFR: 103.03 mL/min (ref >60.00)
Glucose, Bld: 136 mg/dL — ABNORMAL HIGH (ref 70–99)
Potassium: 4.5 mEq/L (ref 3.5–5.1)
Sodium: 140 mEq/L (ref 135–145)
Total Bilirubin: 0.6 mg/dL (ref 0.2–1.2)
Total Protein: 7.1 g/dL (ref 6.0–8.3)

## 2019-07-16 LAB — CBC
HCT: 44.2 % (ref 39.0–52.0)
Hemoglobin: 14.6 g/dL (ref 13.0–17.0)
MCHC: 33.2 g/dL (ref 30.0–36.0)
MCV: 87.6 fl (ref 78.0–100.0)
Platelets: 266 10*3/uL (ref 150.0–400.0)
RBC: 5.04 Mil/uL (ref 4.22–5.81)
RDW: 13.6 % (ref 11.5–15.5)
WBC: 8.1 10*3/uL (ref 4.0–10.5)

## 2019-07-16 LAB — TSH: TSH: 3.23 u[IU]/mL (ref 0.35–4.50)

## 2019-07-16 MED ORDER — ALBUTEROL SULFATE HFA 108 (90 BASE) MCG/ACT IN AERS: 2.0000 | INHALATION_SPRAY | Freq: Four times a day (QID) | RESPIRATORY_TRACT | 3 refills | Status: DC | PRN

## 2019-07-16 NOTE — Progress Notes (Signed)
Please add a1c for elevated glucose.

## 2019-07-16 NOTE — Progress Notes (Signed)
Scott Turner - 43 y.o. male MRN [id number]  Date of birth: 24-Jul-1976  Subjective Chief Complaint  Patient presents with  . Follow-up    2 wk F/U HTN,increased urinating/Refill inhaler     HPI Scott Turner is a 43 y.o. male with recent diagnosis of HTN here today for follow up .  He reports that he is doing well since starting amlodipine.  He denies further headaches since last visit.  He did feel a little sluggish when first starting medication, so he changed to taking in the PM.  This has been better for him.  He is working on reduction of salt intake.  He does reports some increased urinary frequency at night.  Denies significant snoring or daytime fatigue.    ROS:  A comprehensive ROS was completed and negative except as noted per HPI  Allergies  Allergen Reactions  . Sulfa Antibiotics     Childhood allergy     Past Medical History:  Diagnosis Date  . Allergy   . Bronchitis    approx 1 month ago   . Cancer ([place])    basal cell carcinoma on neck    Past Surgical History:  Procedure Laterality Date  . bsal cell carcinoma removed from neck     . EYE SURGERY     lasik surgery bilateral   . TONSILLECTOMY    . UMBILICAL HERNIA REPAIR N/A 01/25/2017   Procedure: HERNIA REPAIR UMBILICAL ADULT;  Surgeon: Clovis Riley, MD;  Location: [facility];  Service: General;  Laterality: N/A;    Social History   Socioeconomic History  . Marital status: Married    Spouse name: Not on file  . Number of children: Not on file  . Years of education: Not on file  . Highest education level: Not on file  Occupational History  . Not on file  Social Needs  . Financial resource strain: Not on file  . Food insecurity    Worry: Not on file    Inability: Not on file  . Transportation needs    Medical: Not on file    Non-medical: Not on file  Tobacco Use  . Smoking status: Former Smoker    Packs/day: 1.00    Years: 10.00    Pack years: 10.00    Types: Cigarettes    Quit date: 09/19/2011     Years since quitting: 7.8  . Smokeless tobacco: Never Used  Substance and Sexual Activity  . Alcohol use: Yes    Comment: maybe 6 pack on weekends   . Drug use: No  . Sexual activity: Yes  Lifestyle  . Physical activity    Days per week: Not on file    Minutes per session: Not on file  . Stress: Not on file  Relationships  . Social Herbalist on phone: Not on file    Gets together: Not on file    Attends religious service: Not on file    Active member of club or organization: Not on file    Attends meetings of clubs or organizations: Not on file    Relationship status: Not on file  Other Topics Concern  . Not on file  Social History Narrative  . Not on file    Family History  Problem Relation Age of Onset  . Heart disease Mother   . Cancer Maternal Grandmother   . Cancer Maternal Grandfather   . Heart disease Paternal Grandmother   . Heart disease Paternal  Grandfather     Health Maintenance  Topic Date Due  . Samul Dada  07/10/1996  . HIV Screening  10/06/2019 (Originally 07/10/1992)  . INFLUENZA VACCINE  07/28/2019    ----------------------------------------------------------------------------------------------------------------------------------------------------------------------------------------------------------------- Physical Exam BP 138/84   Pulse 78   Temp 98.1 F (36.7 C) (Oral)   Resp 16   Ht 6' (1.829 m)   Wt 267 lb (121.1 kg)   SpO2 98%   BMI 36.21 kg/m   Physical Exam Constitutional:      Appearance: Normal appearance.  HENT:     Head: Normocephalic and atraumatic.     Mouth/Throat:     Mouth: Mucous membranes are moist.  Eyes:     General: No scleral icterus. Cardiovascular:     Rate and Rhythm: Normal rate and regular rhythm.  Pulmonary:     Effort: Pulmonary effort is normal.     Breath sounds: Normal breath sounds.  Neurological:     General: No focal deficit present.     Mental Status: He is alert.   Psychiatric:        Mood and Affect: Mood normal.        Behavior: Behavior normal.     ------------------------------------------------------------------------------------------------------------------------------------------------------------------------------------------------------------------- Assessment and Plan  Essential hypertension -BP improved with amlodipine, will continue at current dose.  -Continue to work on lifestyle change.  -Update labs today for evaluation of nocturia.  Denies other symptoms associated with OSA.      Follow up in 6 months

## 2019-07-16 NOTE — Assessment & Plan Note (Signed)
-  BP improved with amlodipine, will continue at current dose.  -Continue to work on lifestyle change.  -Update labs today for evaluation of nocturia.  Denies other symptoms associated with OSA.

## 2019-07-16 NOTE — Patient Instructions (Signed)

## 2019-07-17 ENCOUNTER — Other Ambulatory Visit (INDEPENDENT_AMBULATORY_CARE_PROVIDER_SITE_OTHER): Payer: BC Managed Care – PPO

## 2019-07-17 DIAGNOSIS — R351 Nocturia: Secondary | ICD-10-CM | POA: Diagnosis not present

## 2019-07-17 LAB — HEMOGLOBIN A1C: Hgb A1c MFr Bld: 6.1 % (ref 4.6–6.5)

## 2019-07-24 ENCOUNTER — Other Ambulatory Visit: Payer: Self-pay

## 2019-07-24 ENCOUNTER — Encounter: Payer: Self-pay | Admitting: Family Medicine

## 2019-07-24 NOTE — Progress Notes (Signed)
Blood sugars elevated into the prediabetes range.  He should follow a low carb diet with regular exercise to prevent this from progressing to diabetes.  -One of his liver function tests was a little elevated.  I would like to recheck this in about 6-8 weeks. Please enter hepatic function panel for elevated liver enzymes and schedule lab visit.

## 2019-07-31 NOTE — Telephone Encounter (Addendum)
I have sent over a request to get this corrected and removed.  I would like to actually check a UA on him though.  Thanks!  Addendum:  Unfortunately results can not be removed but we'll repeat test and I've been assured he will not be billed for either test (obviously, the one entered incorrectly and the new one)

## 2019-12-25 ENCOUNTER — Telehealth: Payer: BC Managed Care – PPO | Admitting: Physician Assistant

## 2019-12-25 DIAGNOSIS — Z20822 Contact with and (suspected) exposure to covid-19: Secondary | ICD-10-CM

## 2019-12-25 DIAGNOSIS — R059 Cough, unspecified: Secondary | ICD-10-CM

## 2019-12-25 DIAGNOSIS — R05 Cough: Secondary | ICD-10-CM

## 2019-12-25 NOTE — Progress Notes (Signed)
E-Visit for Corona Virus Screening   Your current symptoms could be consistent with the coronavirus.  Many health care providers can now test patients at their office but not all are.  [facility] has multiple testing sites. For information on our COVID testing locations and hours go to [email or web address]  You need to quarantine due to your close exposure to a COVID positive person.  Please read below for more details.   We are enrolling you in our [place] for [place] . Daily you will receive a questionnaire within the [facility] website. Our COVID 19 response team will be monitoring your responses daily.  Testing Information: The COVID-19 Community Testing sites will begin testing BY APPOINTMENT ONLY.  You can schedule online at [email or web address]  If you do not have access to a smart phone or computer you may call [phone] for an appointment.  Testing Locations: Appointment schedule is 8 am to 3:30 pm at all sites  [facility] indoors at [address] [place] [address] [facility]  indoors at [place]. [address] [place],  [place] [address] [place] indoors at [address] [place] [address]  Additional testing sites in the Community:  . For [facility] Testing sites in [facility]  FaceUpdate.uy  . For Pop-up testing sites in [place]  BowlDirectory.co.uk  . For Testing sites with regular hours [email or web address].[email or web address]  . For [place] [email or web address].si  . For [facility] BasicJet.ca  . For [facility] testing in [place] and [employer]  BasicJet.ca  . For [facility] testing in [facility]   [email or web address].[email or web address]  For  more information about community testing call [phone]   We are enrolling you in our [place] for [place] . Daily you will receive a questionnaire within the [place] website. Our COVID 19 response team will be monitoring your responses daily.  Please quarantine yourself while awaiting your test results. If you develop fever/cough/breathlessness, please stay home for 10 days with improving symptoms and until you have had 24 hours of no fever (without taking a fever reducer).  You should wear a mask or cloth face covering over your nose and mouth if you must be around other people or animals, including pets (even at home). Try to stay at least 6 feet away from other people. This will protect the people around you.  Please continue good preventive care measures, including:  frequent hand-washing, avoid touching your face, cover coughs/sneezes, stay out of crowds and keep a 6 foot distance from others.  COVID-19 is a respiratory illness with symptoms that are similar to the flu. Symptoms are typically mild to moderate, but there have been cases of severe illness and death due to the virus.   The following symptoms may appear 2-14 days after exposure: . Fever . Cough . Shortness of breath or difficulty breathing . Chills . Repeated shaking with chills . Muscle pain . Headache . Sore throat . New loss of taste or smell . Fatigue . Congestion or runny nose . Nausea or vomiting . Diarrhea  Go to the nearest hospital ED for assessment if fever/cough/breathlessness are severe or illness seems like a threat to life.  It is vitally important that if you feel that you have an infection such as this virus or any other virus that you stay home and away from places where you may spread it to  others.  You should avoid contact with people age 23 and older.   You  can use medication such as A prescription cough medication called Tessalon Perles 100 mg. You may take 1-2 capsules every 8 hours as needed for cough  You may also take acetaminophen (Tylenol) as needed for fever.  Reduce your risk of any infection by using the same precautions used for avoiding the common cold or flu:  Marland Kitchen Wash your hands often with soap and warm water for at least 20 seconds.  If soap and water are not readily available, use an alcohol-based hand sanitizer with at least 60% alcohol.  . If coughing or sneezing, cover your mouth and nose by coughing or sneezing into the elbow areas of your shirt or coat, into a tissue or into your sleeve (not your hands). . Avoid shaking hands with others and consider head nods or verbal greetings only. . Avoid touching your eyes, nose, or mouth with unwashed hands.  . Avoid close contact with people who are sick. . Avoid places or events with large numbers of people in one location, like concerts or sporting events. . Carefully consider travel plans you have or are making. . If you are planning any travel outside or inside the [place], visit the CDC's Travelers' Health webpage for the latest health notices. . If you have some symptoms but not all symptoms, continue to monitor at home and seek medical attention if your symptoms worsen. . If you are having a medical emergency, call 911.  HOME CARE . Only take medications as instructed by your medical team. . Drink plenty of fluids and get plenty of rest. . A steam or ultrasonic humidifier can help if you have congestion.   GET HELP RIGHT AWAY IF YOU HAVE EMERGENCY WARNING SIGNS** FOR COVID-19. If you or someone is showing any of these signs seek emergency medical care immediately. Call 911 or proceed to your closest emergency facility if: . You develop worsening high fever. . Trouble breathing . Bluish lips or face . Persistent  pain or pressure in the chest . New confusion . Inability to wake or stay awake . You cough up blood. . Your symptoms become more severe  **This list is not all possible symptoms. Contact your medical provider for any symptoms that are sever or concerning to you.  MAKE SURE YOU   Understand these instructions.  Will watch your condition.  Will get help right away if you are not doing well or get worse.  Your e-visit answers were reviewed by a board certified advanced clinical practitioner to complete your personal care plan.  Depending on the condition, your plan could have included both over the counter or prescription medications.  If there is a problem please reply once you have received a response from your provider.  Your safety is important to [place].  If you have drug allergies check your prescription carefully.    You can use MyChart to ask questions about today's visit, request a non-urgent call back, or ask for a work or school excuse for 24 hours related to this e-Visit. If it has been greater than 24 hours you will need to follow up with your provider, or enter a new e-Visit to address those concerns. You will get an e-mail in the next two days asking about your experience.  I hope that your e-visit has been valuable and will speed your recovery. Thank you for using e-visits.   Greater than 5 minutes, yet less than 10 minutes of time have been spent researching, coordinating and implementing care for this patient  today.

## 2019-12-31 ENCOUNTER — Other Ambulatory Visit: Payer: BC Managed Care – PPO

## 2020-09-10 ENCOUNTER — Encounter: Payer: Self-pay | Admitting: Nurse Practitioner

## 2020-09-10 ENCOUNTER — Other Ambulatory Visit: Payer: Self-pay | Admitting: Nurse Practitioner

## 2020-09-10 DIAGNOSIS — R7303 Prediabetes: Secondary | ICD-10-CM | POA: Insufficient documentation

## 2020-11-16 DIAGNOSIS — Z20822 Contact with and (suspected) exposure to covid-19: Secondary | ICD-10-CM | POA: Diagnosis not present

## 2020-12-08 ENCOUNTER — Other Ambulatory Visit: Payer: Self-pay | Admitting: Student

## 2020-12-08 ENCOUNTER — Encounter: Payer: Self-pay | Admitting: Family Medicine

## 2020-12-08 ENCOUNTER — Ambulatory Visit (HOSPITAL_COMMUNITY)
Admission: RE | Admit: 2020-12-08 | Discharge: 2020-12-08 | Disposition: A | Discharge status: Home / Self Care | Payer: BC Managed Care – PPO | Source: Ambulatory Visit | Attending: Student | Admitting: Student

## 2020-12-08 ENCOUNTER — Other Ambulatory Visit (HOSPITAL_COMMUNITY): Payer: Self-pay | Admitting: Student

## 2020-12-08 ENCOUNTER — Other Ambulatory Visit: Payer: Self-pay

## 2020-12-08 ENCOUNTER — Ambulatory Visit (INDEPENDENT_AMBULATORY_CARE_PROVIDER_SITE_OTHER): Payer: BC Managed Care – PPO | Admitting: Family Medicine

## 2020-12-08 DIAGNOSIS — R1033 Periumbilical pain: Secondary | ICD-10-CM

## 2020-12-08 DIAGNOSIS — K760 Fatty (change of) liver, not elsewhere classified: Secondary | ICD-10-CM | POA: Diagnosis not present

## 2020-12-08 DIAGNOSIS — Z09 Encounter for follow-up examination after completed treatment for conditions other than malignant neoplasm: Secondary | ICD-10-CM | POA: Diagnosis not present

## 2020-12-08 MED ORDER — IOHEXOL 300 MG/ML  SOLN
100.0000 mL | Freq: Once | INTRAMUSCULAR | Status: AC | PRN
  Administered 2020-12-08: 19:00:00 100 mL via INTRAVENOUS

## 2020-12-08 MED ORDER — TRAMADOL HCL 50 MG PO TABS: 50.0000 mg | ORAL_TABLET | Freq: Three times a day (TID) | ORAL | 0 refills | Status: AC | PRN

## 2020-12-08 NOTE — Assessment & Plan Note (Addendum)
Indurated area around umbilicus. Previous umbilical hernia repair Incarcerated umbilical hernia vs periumbilical abscess.  Would favor abscess given appearance and absence of GI symptoms.   Discussed obtaining US or CT for further evaluation but I think he needs urgent surgical referral as well.  Will defer imaging if needed to surgeon.  Appt made for patient with [facility] at 2:30 today.

## 2020-12-08 NOTE — Progress Notes (Signed)
Scott Turner - 44 y.o. male MRN [id number]  Date of birth: [birth year]  Subjective No chief complaint on file.   HPI Scott Turner is a 44 y.o. male here today with complaint of umbilical pain.  He first noticed pain about 3 days ago.  Since that time the pain has worsened and area has gotten larger.  He denies changes to his bowels, nausea or vomiting.  He has not had fever or chills.  He has history of umbilical hernia repair with Dr. Kae Heller in 2018.  He is taking tylenol without relief.     Allergies  Allergen Reactions  . Sulfa Antibiotics     Childhood allergy     Past Medical History:  Diagnosis Date  . Allergy   . Bronchitis    approx 1 month ago   . Cancer ([place])    basal cell carcinoma on neck  . Elevated blood pressure reading 07/02/2019    Past Surgical History:  Procedure Laterality Date  . bsal cell carcinoma removed from neck     . EYE SURGERY     lasik surgery bilateral   . TONSILLECTOMY    . UMBILICAL HERNIA REPAIR N/A 01/25/2017   Procedure: HERNIA REPAIR UMBILICAL ADULT;  Surgeon: Clovis Riley, MD;  Location: [facility];  Service: General;  Laterality: N/A;    Social History   Socioeconomic History  . Marital status: Married    Spouse name: Not on file  . Number of children: Not on file  . Years of education: Not on file  . Highest education level: Not on file  Occupational History  . Not on file  Tobacco Use  . Smoking status: Former Smoker    Packs/day: 1.00    Years: 10.00    Pack years: 10.00    Types: Cigarettes    Quit date: 09/19/2011    Years since quitting: 9.2  . Smokeless tobacco: Never Used  Vaping Use  . Vaping Use: Never used  Substance and Sexual Activity  . Alcohol use: Yes    Comment: maybe 6 pack on weekends   . Drug use: No  . Sexual activity: Yes  Other Topics Concern  . Not on file  Social History Narrative  . Not on file   Social Determinants of Health   Financial Resource Strain: Not on file  Food Insecurity:  Not on file  Transportation Needs: Not on file  Physical Activity: Not on file  Stress: Not on file  Social Connections: Not on file    Family History  Problem Relation Age of Onset  . Heart disease Mother   . Cancer Maternal Grandmother   . Cancer Maternal Grandfather   . Heart disease Paternal Grandmother   . Heart disease Paternal Grandfather     Health Maintenance  Topic Date Due  . Hepatitis C Screening  Never done  . COVID-19 Vaccine (1) Never done  . HIV Screening  Never done  . TETANUS/TDAP  Never done  . INFLUENZA VACCINE  Never done     ----------------------------------------------------------------------------------------------------------------------------------------------------------------------------------------------------------------- Physical Exam BP (!) 159/87   Pulse 84   Temp 98.3 F (36.8 C)   Wt 267 lb (121.1 kg)   BMI 36.21 kg/m   Physical Exam Constitutional:      Appearance: Normal appearance.  Eyes:     General: No scleral icterus. Cardiovascular:     Rate and Rhythm: Normal rate and regular rhythm.  Pulmonary:     Effort: Pulmonary effort is normal.  Breath sounds: Normal breath sounds.  Abdominal:     Comments: Induration with tenderness of umbilicus.  Area is quite firm without significant fluctuance.  There is surrounding induration and erythema around the umbilicus.    Neurological:     General: No focal deficit present.     Mental Status: He is alert.  Psychiatric:        Mood and Affect: Mood normal.     ------------------------------------------------------------------------------------------------------------------------------------------------------------------------------------------------------------------- Assessment and Plan  Umbilical pain Indurated area around umbilicus. Previous umbilical hernia repair Incarcerated umbilical hernia vs periumbilical abscess. Discussed obtaining US or CT for further evaluation  but I think he needs urgent surgical referral as well.  Will defer imaging if needed to surgeon.  Appt made for patient with [facility] at 2:30 today.       No orders of the defined types were placed in this encounter.   No follow-ups on file.    This visit occurred during the SARS-CoV-2 public health emergency.  Safety protocols were in place, including screening questions prior to the visit, additional usage of staff PPE, and extensive cleaning of exam room while observing appropriate contact time as indicated for disinfecting solutions.

## 2020-12-09 ENCOUNTER — Observation Stay (HOSPITAL_COMMUNITY): Payer: BC Managed Care – PPO | Admitting: Anesthesiology

## 2020-12-09 ENCOUNTER — Encounter (HOSPITAL_COMMUNITY): Payer: Self-pay

## 2020-12-09 ENCOUNTER — Encounter (HOSPITAL_COMMUNITY): Admission: RE | Disposition: A | Discharge status: Home / Self Care | Payer: Self-pay | Source: Ambulatory Visit

## 2020-12-09 ENCOUNTER — Ambulatory Visit (HOSPITAL_COMMUNITY)
Admission: RE | Admit: 2020-12-09 | Discharge: 2020-12-09 | Disposition: A | Discharge status: Home / Self Care | Payer: BC Managed Care – PPO | Source: Ambulatory Visit | Attending: General Surgery | Admitting: General Surgery

## 2020-12-09 ENCOUNTER — Other Ambulatory Visit: Payer: Self-pay | Admitting: Student

## 2020-12-09 DIAGNOSIS — J40 Bronchitis, not specified as acute or chronic: Secondary | ICD-10-CM | POA: Diagnosis not present

## 2020-12-09 DIAGNOSIS — L03311 Cellulitis of abdominal wall: Secondary | ICD-10-CM | POA: Diagnosis not present

## 2020-12-09 DIAGNOSIS — K429 Umbilical hernia without obstruction or gangrene: Secondary | ICD-10-CM | POA: Diagnosis present

## 2020-12-09 DIAGNOSIS — Z882 Allergy status to sulfonamides status: Secondary | ICD-10-CM | POA: Insufficient documentation

## 2020-12-09 DIAGNOSIS — K420 Umbilical hernia with obstruction, without gangrene: Secondary | ICD-10-CM | POA: Diagnosis not present

## 2020-12-09 DIAGNOSIS — Z79891 Long term (current) use of opiate analgesic: Secondary | ICD-10-CM | POA: Insufficient documentation

## 2020-12-09 DIAGNOSIS — Z79899 Other long term (current) drug therapy: Secondary | ICD-10-CM | POA: Diagnosis not present

## 2020-12-09 DIAGNOSIS — Z20822 Contact with and (suspected) exposure to covid-19: Secondary | ICD-10-CM | POA: Insufficient documentation

## 2020-12-09 DIAGNOSIS — I10 Essential (primary) hypertension: Secondary | ICD-10-CM | POA: Diagnosis not present

## 2020-12-09 DIAGNOSIS — R7303 Prediabetes: Secondary | ICD-10-CM | POA: Diagnosis not present

## 2020-12-09 DIAGNOSIS — K661 Hemoperitoneum: Secondary | ICD-10-CM | POA: Diagnosis not present

## 2020-12-09 HISTORY — PX: UMBILICAL HERNIA REPAIR: SHX196

## 2020-12-09 LAB — BASIC METABOLIC PANEL
Anion gap: 8 (ref 5–15)
BUN: 12 mg/dL (ref 6–20)
CO2: 27 mmol/L (ref 22–32)
Calcium: 9.1 mg/dL (ref 8.9–10.3)
Chloride: 101 mmol/L (ref 98–111)
Creatinine, Ser: 0.82 mg/dL (ref 0.61–1.24)
GFR, Estimated: >60 mL/min (ref >60)
Glucose, Bld: 122 mg/dL — ABNORMAL HIGH (ref 70–99)
Potassium: 3.7 mmol/L (ref 3.5–5.1)
Sodium: 136 mmol/L (ref 135–145)

## 2020-12-09 LAB — CBC WITH DIFFERENTIAL/PLATELET
Abs Immature Granulocytes: 0.05 10*3/uL (ref 0.00–0.07)
Basophils Absolute: 0 10*3/uL (ref 0.0–0.1)
Basophils Relative: 0 %
Eosinophils Absolute: 0.4 10*3/uL (ref 0.0–0.5)
Eosinophils Relative: 3 %
HCT: 44.8 % (ref 39.0–52.0)
Hemoglobin: 15.2 g/dL (ref 13.0–17.0)
Immature Granulocytes: 1 %
Lymphocytes Relative: 17 %
Lymphs Abs: 1.8 10*3/uL (ref 0.7–4.0)
MCH: 30 pg (ref 26.0–34.0)
MCHC: 33.9 g/dL (ref 30.0–36.0)
MCV: 88.5 fL (ref 80.0–100.0)
Monocytes Absolute: 1.1 10*3/uL — ABNORMAL HIGH (ref 0.1–1.0)
Monocytes Relative: 10 %
Neutro Abs: 7.2 10*3/uL (ref 1.7–7.7)
Neutrophils Relative %: 69 %
Platelets: 250 10*3/uL (ref 150–400)
RBC: 5.06 MIL/uL (ref 4.22–5.81)
RDW: 13.2 % (ref 11.5–15.5)
WBC: 10.5 10*3/uL (ref 4.0–10.5)
nRBC: 0 % (ref 0.0–0.2)

## 2020-12-09 LAB — RESP PANEL BY RT-PCR (FLU A&B, COVID) ARPGX2
Influenza A by PCR: NEGATIVE
Influenza B by PCR: NEGATIVE
SARS Coronavirus 2 by RT PCR: NEGATIVE

## 2020-12-09 SURGERY — REPAIR, HERNIA, UMBILICAL, ADULT
Anesthesia: General

## 2020-12-09 MED ORDER — PROPOFOL 10 MG/ML IV BOLUS
INTRAVENOUS | Status: DC | PRN
  Administered 2020-12-09: 200 mg via INTRAVENOUS

## 2020-12-09 MED ORDER — FENTANYL CITRATE (PF) 100 MCG/2ML IJ SOLN: 25.0000 ug | INTRAMUSCULAR | Status: DC | PRN

## 2020-12-09 MED ORDER — 0.9 % SODIUM CHLORIDE (POUR BTL) OPTIME
TOPICAL | Status: DC | PRN
  Administered 2020-12-09: 15:00:00 1000 mL

## 2020-12-09 MED ORDER — MIDAZOLAM HCL 5 MG/5ML IJ SOLN
INTRAMUSCULAR | Status: DC | PRN
  Administered 2020-12-09: 2 mg via INTRAVENOUS

## 2020-12-09 MED ORDER — DIPHENHYDRAMINE HCL 50 MG/ML IJ SOLN: 25.0000 mg | Freq: Four times a day (QID) | INTRAMUSCULAR | Status: DC | PRN

## 2020-12-09 MED ORDER — ROCURONIUM BROMIDE 100 MG/10ML IV SOLN
INTRAVENOUS | Status: DC | PRN
  Administered 2020-12-09: 70 mg via INTRAVENOUS

## 2020-12-09 MED ORDER — ONDANSETRON 4 MG PO TBDP: 4.0000 mg | ORAL_TABLET | Freq: Four times a day (QID) | ORAL | Status: DC | PRN

## 2020-12-09 MED ORDER — CHLORHEXIDINE GLUCONATE 0.12 % MT SOLN
15.0000 mL | OROMUCOSAL | Status: AC
  Administered 2020-12-09: 12:00:00 15 mL via OROMUCOSAL

## 2020-12-09 MED ORDER — HYDROMORPHONE HCL 1 MG/ML IJ SOLN: 1.0000 mg | INTRAMUSCULAR | Status: DC | PRN

## 2020-12-09 MED ORDER — ACETAMINOPHEN 500 MG PO TABS: 1000.0000 mg | ORAL_TABLET | Freq: Four times a day (QID) | ORAL | 0 refills | Status: DC

## 2020-12-09 MED ORDER — LIDOCAINE HCL (CARDIAC) PF 100 MG/5ML IV SOSY
PREFILLED_SYRINGE | INTRAVENOUS | Status: DC | PRN
  Administered 2020-12-09: 100 mg via INTRAVENOUS

## 2020-12-09 MED ORDER — KETOROLAC TROMETHAMINE 30 MG/ML IJ SOLN
INTRAMUSCULAR | Status: DC | PRN
  Administered 2020-12-09: 30 mg via INTRAVENOUS

## 2020-12-09 MED ORDER — FENTANYL CITRATE (PF) 100 MCG/2ML IJ SOLN
INTRAMUSCULAR | Status: AC
  Filled 2020-12-09: qty 2

## 2020-12-09 MED ORDER — PROPOFOL 10 MG/ML IV BOLUS
INTRAVENOUS | Status: AC
  Filled 2020-12-09: qty 20

## 2020-12-09 MED ORDER — KETOROLAC TROMETHAMINE 30 MG/ML IJ SOLN
INTRAMUSCULAR | Status: AC
  Filled 2020-12-09: qty 1

## 2020-12-09 MED ORDER — ONDANSETRON HCL 4 MG/2ML IJ SOLN
INTRAMUSCULAR | Status: DC | PRN
  Administered 2020-12-09: 4 mg via INTRAVENOUS

## 2020-12-09 MED ORDER — PIPERACILLIN-TAZOBACTAM 3.375 G IVPB
3.3750 g | Freq: Three times a day (TID) | INTRAVENOUS | Status: DC
  Administered 2020-12-09: 14:00:00 3.375 g via INTRAVENOUS

## 2020-12-09 MED ORDER — MIDAZOLAM HCL 2 MG/2ML IJ SOLN
INTRAMUSCULAR | Status: AC
  Filled 2020-12-09: qty 2

## 2020-12-09 MED ORDER — LACTATED RINGERS IV SOLN: Freq: Once | INTRAVENOUS | Status: AC

## 2020-12-09 MED ORDER — SUGAMMADEX SODIUM 500 MG/5ML IV SOLN
INTRAVENOUS | Status: AC
  Filled 2020-12-09: qty 5

## 2020-12-09 MED ORDER — ONDANSETRON HCL 4 MG/2ML IJ SOLN: 4.0000 mg | Freq: Four times a day (QID) | INTRAMUSCULAR | Status: DC | PRN

## 2020-12-09 MED ORDER — SODIUM CHLORIDE 0.9 % IV SOLN: INTRAVENOUS | Status: DC

## 2020-12-09 MED ORDER — DEXAMETHASONE SODIUM PHOSPHATE 10 MG/ML IJ SOLN
INTRAMUSCULAR | Status: DC | PRN
  Administered 2020-12-09: 10 mg via INTRAVENOUS

## 2020-12-09 MED ORDER — LIDOCAINE HCL (PF) 1 % IJ SOLN
INTRAMUSCULAR | Status: AC
  Filled 2020-12-09: qty 30

## 2020-12-09 MED ORDER — OXYCODONE HCL 5 MG PO TABS: 5.0000 mg | ORAL_TABLET | Freq: Four times a day (QID) | ORAL | 0 refills | Status: DC | PRN

## 2020-12-09 MED ORDER — DIPHENHYDRAMINE HCL 25 MG PO CAPS: 25.0000 mg | ORAL_CAPSULE | Freq: Four times a day (QID) | ORAL | Status: DC | PRN

## 2020-12-09 MED ORDER — HYDROCODONE-ACETAMINOPHEN 5-325 MG PO TABS: 1.0000 | ORAL_TABLET | ORAL | Status: DC | PRN

## 2020-12-09 MED ORDER — PIPERACILLIN-TAZOBACTAM 3.375 G IVPB
INTRAVENOUS | Status: AC
  Filled 2020-12-09: qty 50

## 2020-12-09 MED ORDER — FENTANYL CITRATE (PF) 100 MCG/2ML IJ SOLN
INTRAMUSCULAR | Status: DC | PRN
  Administered 2020-12-09 (×3): 100 ug via INTRAVENOUS

## 2020-12-09 MED ORDER — SUGAMMADEX SODIUM 200 MG/2ML IV SOLN
INTRAVENOUS | Status: DC | PRN
  Administered 2020-12-09: 250 mg via INTRAVENOUS

## 2020-12-09 MED ORDER — ROCURONIUM BROMIDE 10 MG/ML (PF) SYRINGE
PREFILLED_SYRINGE | INTRAVENOUS | Status: AC
  Filled 2020-12-09: qty 10

## 2020-12-09 MED ORDER — HYDROCODONE-ACETAMINOPHEN 7.5-325 MG PO TABS: 1.0000 | ORAL_TABLET | Freq: Once | ORAL | Status: DC | PRN

## 2020-12-09 MED ORDER — ACETAMINOPHEN 500 MG PO TABS: 1000.0000 mg | ORAL_TABLET | Freq: Four times a day (QID) | ORAL | Status: DC

## 2020-12-09 MED ORDER — BUPIVACAINE-EPINEPHRINE (PF) 0.25% -1:200000 IJ SOLN
INTRAMUSCULAR | Status: AC
  Filled 2020-12-09: qty 30

## 2020-12-09 MED ORDER — LACTATED RINGERS IV SOLN: INTRAVENOUS | Status: DC | PRN

## 2020-12-09 MED ORDER — ONDANSETRON HCL 4 MG/2ML IJ SOLN: 4.0000 mg | Freq: Once | INTRAMUSCULAR | Status: DC | PRN

## 2020-12-09 MED ORDER — LIDOCAINE HCL (PF) 2 % IJ SOLN
INTRAMUSCULAR | Status: AC
  Filled 2020-12-09: qty 5

## 2020-12-09 SURGICAL SUPPLY — 49 items
BENZOIN TINCTURE PRP APPL 2/3 (GAUZE/BANDAGES/DRESSINGS) ×3 IMPLANT
BINDER ABDOMINAL 12 ML 46-62 (SOFTGOODS) ×3 IMPLANT
BLADE HEX COATED 2.75 (ELECTRODE) ×3 IMPLANT
BLADE SURG 15 STRL LF DISP TIS (BLADE) ×1 IMPLANT
BLADE SURG 15 STRL SS (BLADE) ×2
CLOSURE WOUND 1/2 X4 (GAUZE/BANDAGES/DRESSINGS) ×1
COVER SURGICAL LIGHT HANDLE (MISCELLANEOUS) ×3 IMPLANT
COVER WAND RF STERILE (DRAPES) IMPLANT
DECANTER SPIKE VIAL GLASS SM (MISCELLANEOUS) ×3 IMPLANT
DERMABOND ADVANCED (GAUZE/BANDAGES/DRESSINGS)
DERMABOND ADVANCED .7 DNX12 (GAUZE/BANDAGES/DRESSINGS) IMPLANT
DRAIN PENROSE 0.5X18 (DRAIN) ×3 IMPLANT
DRAPE LAPAROSCOPIC ABDOMINAL (DRAPES) ×3 IMPLANT
DRSG PAD ABDOMINAL 8X10 ST (GAUZE/BANDAGES/DRESSINGS) ×3 IMPLANT
ELECT REM PT RETURN 15FT ADLT (MISCELLANEOUS) ×3 IMPLANT
GAUZE SPONGE 4X4 12PLY STRL (GAUZE/BANDAGES/DRESSINGS) ×3 IMPLANT
GLOVE BIO SURGEON STRL SZ 6 (GLOVE) ×3 IMPLANT
GLOVE BIO SURGEON STRL SZ7 (GLOVE) ×6 IMPLANT
GLOVE BIOGEL PI IND STRL 6.5 (GLOVE) ×1 IMPLANT
GLOVE BIOGEL PI IND STRL 7.0 (GLOVE) ×1 IMPLANT
GLOVE BIOGEL PI INDICATOR 6.5 (GLOVE) ×2
GLOVE BIOGEL PI INDICATOR 7.0 (GLOVE) ×2
GLOVE INDICATOR 6.5 STRL GRN (GLOVE) ×3 IMPLANT
GOWN STRL REUS W/ TWL XL LVL3 (GOWN DISPOSABLE) ×3 IMPLANT
GOWN STRL REUS W/TWL 2XL LVL3 (GOWN DISPOSABLE) ×3 IMPLANT
GOWN STRL REUS W/TWL XL LVL3 (GOWN DISPOSABLE) ×6
KIT BASIN OR (CUSTOM PROCEDURE TRAY) ×3 IMPLANT
KIT TURNOVER KIT A (KITS) IMPLANT
NEEDLE HYPO 25X1 1.5 SAFETY (NEEDLE) ×3 IMPLANT
NS IRRIG 1000ML POUR BTL (IV SOLUTION) ×3 IMPLANT
PACK BASIC VI WITH GOWN DISP (CUSTOM PROCEDURE TRAY) ×3 IMPLANT
PENCIL SMOKE EVACUATOR (MISCELLANEOUS) IMPLANT
SPONGE LAP 18X18 RF (DISPOSABLE) ×3 IMPLANT
STRIP CLOSURE SKIN 1/2X4 (GAUZE/BANDAGES/DRESSINGS) ×2 IMPLANT
SUT MNCRL AB 4-0 PS2 18 (SUTURE) ×3 IMPLANT
SUT NOVA NAB DX-16 0-1 5-0 T12 (SUTURE) ×6 IMPLANT
SUT PROLENE 2 0 SH DA (SUTURE) ×12 IMPLANT
SUT SILK 2 0 SH (SUTURE) IMPLANT
SUT SILK 3 0 (SUTURE)
SUT SILK 3-0 18XBRD TIE 12 (SUTURE) IMPLANT
SUT VIC AB 2-0 SH 27 (SUTURE) ×4
SUT VIC AB 2-0 SH 27X BRD (SUTURE) ×2 IMPLANT
SUT VIC AB 3-0 SH 27 (SUTURE) ×6
SUT VIC AB 3-0 SH 27X BRD (SUTURE) ×1 IMPLANT
SUT VIC AB 3-0 SH 27XBRD (SUTURE) ×2 IMPLANT
SYR BULB IRRIG 60ML STRL (SYRINGE) ×3 IMPLANT
SYR CONTROL 10ML LL (SYRINGE) ×3 IMPLANT
TAPE STRIPS DRAPE STRL (GAUZE/BANDAGES/DRESSINGS) ×3 IMPLANT
TOWEL OR 17X26 10 PK STRL BLUE (TOWEL DISPOSABLE) ×3 IMPLANT

## 2020-12-09 NOTE — Discharge Instructions (Signed)
[facility]  UMBILICAL OR INGUINAL HERNIA REPAIR: POST OP INSTRUCTIONS  Always review your discharge instruction sheet given to you by the facility where your surgery was performed. IF YOU HAVE DISABILITY OR FAMILY LEAVE FORMS, YOU MUST BRING THEM TO THE OFFICE FOR PROCESSING.   DO NOT GIVE THEM TO YOUR DOCTOR.  1. A  prescription for pain medication may be given to you upon discharge.  Take your pain medication as prescribed, if needed.  If narcotic pain medicine is not needed, then you may take acetaminophen (Tylenol) or ibuprofen (Advil) as needed. 2. Take your usually prescribed medications unless otherwise directed. 3. If you need a refill on your pain medication, please contact your pharmacy.  They will contact our office to request authorization. Prescriptions will not be filled after 5 pm or on week-ends. 4. You should follow a light diet the first 24 hours after arrival home, such as soup and crackers, etc.  Be sure to include lots of fluids daily.  Resume your normal diet the day after surgery. 5. Most patients will experience some swelling and bruising around the umbilicus or in the groin and scrotum.  Ice packs and reclining will help.  Swelling and bruising can take several days to resolve.  6. It is common to experience some constipation if taking pain medication after surgery.  Increasing fluid intake and taking a stool softener (such as Colace) will usually help or prevent this problem from occurring.  A mild laxative (Milk of Magnesia or Miralax) should be taken according to package directions if there are no bowel movements after 48 hours. 7. Unless discharge instructions indicate otherwise, you may remove your bandages 24-48 hours after surgery, and you may shower at that time.  You may have steri-strips (small skin tapes) in place directly over the incision.  These strips should be left on the skin for 7-10 days.  If your surgeon used skin glue on the incision,  you may shower in 24 hours.  The glue will flake off over the next 2-3 weeks.  Any sutures or staples will be removed at the office during your follow-up visit. 8. ACTIVITIES:  You may resume regular (light) daily activities beginning the next day--such as daily self-care, walking, climbing stairs--gradually increasing activities as tolerated.  You may have sexual intercourse when it is comfortable.  Refrain from any heavy lifting or straining until approved by your doctor. a. You may drive when you are no longer taking prescription pain medication, you can comfortably wear a seatbelt, and you can safely maneuver your car and apply brakes. b. RETURN TO WORK: 2 weeks 9. You should see your doctor in the office for a follow-up appointment approximately 2-3 weeks after your surgery.  Make sure that you call for this appointment within a day or two after you arrive home to insure a convenient appointment time.   WHEN TO CALL YOUR DOCTOR: 1. Fever over 101.0 2. Inability to urinate 3. Nausea and/or vomiting 4. Extreme swelling or bruising 5. Continued bleeding from incision. 6. Increased pain, redness, or drainage from the incision  The clinic staff is available to answer your questions during regular business hours.  Please don't hesitate to call and ask to speak to one of the nurses for clinical concerns.  If you have a medical emergency, go to the nearest emergency room or call 911.  A surgeon from [facility] is always on call at the hospital   [address],  [place], [place], [place]  [address] ?  [address], [place], [place]   [address] [phone] ? [phone] ? FAX [phone] Web site: [email or web address]

## 2020-12-09 NOTE — Anesthesia Preprocedure Evaluation (Signed)
Anesthesia Evaluation  Patient identified by MRN, date of birth, ID band Patient awake    Reviewed: Allergy & Precautions, NPO status , Patient's Chart, lab work & pertinent test results  Airway Mallampati: II  TM Distance: >3 FB Neck ROM: Full    Dental no notable dental hx. (+) Teeth Intact   Pulmonary former smoker,    Pulmonary exam normal breath sounds clear to auscultation       Cardiovascular hypertension, Normal cardiovascular exam Rhythm:Regular Rate:Normal     Neuro/Psych  Headaches, negative psych ROS   GI/Hepatic negative GI ROS, Neg liver ROS,   Endo/Other  Obesity  Renal/GU negative Renal ROS  negative genitourinary   Musculoskeletal Recurrent umbilical hernia- incarcerated   Abdominal (+) + obese,   Peds  Hematology negative hematology ROS (+)   Anesthesia Other Findings   Reproductive/Obstetrics                             Anesthesia Physical Anesthesia Plan  ASA: II  Anesthesia Plan: General   Post-op Pain Management:    Induction: Intravenous  PONV Risk Score and Plan: 3 and Midazolam, Ondansetron and Treatment may vary due to age or medical condition  Airway Management Planned: Oral ETT  Additional Equipment:   Intra-op Plan:   Post-operative Plan: Extubation in OR  Informed Consent: I have reviewed the patients History and Physical, chart, labs and discussed the procedure including the risks, benefits and alternatives for the proposed anesthesia with the patient or authorized representative who has indicated his/her understanding and acceptance.     Dental advisory given  Plan Discussed with: CRNA and Anesthesiologist  Anesthesia Plan Comments:         Anesthesia Quick Evaluation

## 2020-12-09 NOTE — Anesthesia Postprocedure Evaluation (Signed)
Anesthesia Post Note  Patient: Scott Turner  Procedure(s) Performed: HERNIA REPAIR UMBILICAL ADULT (N/A )     Patient location during evaluation: PACU Anesthesia Type: General Level of consciousness: awake and alert and oriented Pain management: pain level controlled Vital Signs Assessment: post-procedure vital signs reviewed and stable Respiratory status: spontaneous breathing, nonlabored ventilation and respiratory function stable Cardiovascular status: blood pressure returned to baseline and stable Postop Assessment: no apparent nausea or vomiting Anesthetic complications: no   No complications documented.  Last Vitals:  Vitals:   12/09/20 1615 12/09/20 1630  BP: (!) 158/95 (!) 149/87  Pulse: 79 74  Resp: 12 12  Temp:    SpO2: 94% 96%    Last Pain:  Vitals:   12/09/20 1615  TempSrc:   PainSc: 2                  Sahid Borba A.

## 2020-12-09 NOTE — H&P (Signed)
Scott Turner Appointment: 12/08/2020 2:45 PM Location: [facility] Patient #: [id number] DOB: 1976-05-29 Married / Language: English / Race: White Male   History of Present Illness  The patient is a 44 year old male presenting for a post-operative visit. He is presenting to urgent office per request of his PCP, Dr. Zigmund Daniel, who evaluated him earlier today. He is status post primary umbilical hernia repair on 01/25/17 by Dr. Kae Heller. He was doing very well until about 3 days ago when he developed umbilical pain. Two days ago, the area became more painful and swollen. Redness started today (12/08/20). He denies drainage and fever. Denies nausea, vomiting, and issues with his bowels.  Denies hx of smoking, DM, and HIV.  Is not taking any blood thinners.  Allergies Sulfa Antibiotics  Allergies Reconciled   Medication History  ProAir HFA (108 (90 Base)MCG/ACT Aerosol Soln, Inhalation) Active. Fluticasone Propionate (50MCG/ACT Suspension, Nasal) Active. Loratadine (10MG  Capsule, Oral) Active. traMADol HCl (50MG  Tablet, Oral) Active. Medications Reconciled  Review of Systems General Not Present- Appetite Loss, Chills, Fatigue, Fever, Night Sweats, Weight Gain and Weight Loss. Skin Not Present- Change in Wart/Mole, Dryness, Hives, Jaundice, New Lesions, Non-Healing Wounds, Rash and Ulcer. HEENT Not Present- Earache, Hearing Loss, Hoarseness, Nose Bleed, Oral Ulcers, Ringing in the Ears, Seasonal Allergies, Sinus Pain, Sore Throat, Visual Disturbances, Wears glasses/contact lenses and Yellow Eyes. Respiratory Not Present- Bloody sputum, Chronic Cough, Difficulty Breathing, Snoring and Wheezing. Breast Not Present- Breast Mass, Breast Pain, Nipple Discharge and Skin Changes. Cardiovascular Not Present- Chest Pain, Difficulty Breathing Lying Down, Leg Cramps, Palpitations, Rapid Heart Rate, Shortness of Breath and Swelling of Extremities. Gastrointestinal Present- Abdominal  Pain. Not Present- Bloating, Bloody Stool, Change in Bowel Habits, Chronic diarrhea, Constipation, Difficulty Swallowing, Excessive gas, Gets full quickly at meals, Hemorrhoids, Indigestion, Nausea, Rectal Pain and Vomiting. Male Genitourinary Not Present- Blood in Urine, Change in Urinary Stream, Frequency, Impotence, Nocturia, Painful Urination, Urgency and Urine Leakage. Musculoskeletal Not Present- Back Pain, Joint Pain, Joint Stiffness, Muscle Pain, Muscle Weakness and Swelling of Extremities. Neurological Not Present- Decreased Memory, Fainting, Headaches, Numbness, Seizures, Tingling, Tremor, Trouble walking and Weakness. Psychiatric Not Present- Anxiety, Bipolar, Change in Sleep Pattern, Depression, Fearful and Frequent crying. Endocrine Not Present- Cold Intolerance, Excessive Hunger, Hair Changes, Heat Intolerance and New Diabetes. Hematology Not Present- Blood Thinners, Easy Bruising, Excessive bleeding, Gland problems, HIV and Persistent Infections.  Vitals  12/08/2020 3:04 PM Weight: 268.5 lb Height: 72in Body Surface Area: 2.41 m Body Mass Index: 36.41 kg/m  Temp.: 98.50F  Pulse: 105 (Regular)  P.OX: 97% (Room air) BP: 176/96(Sitting, Left Arm, Standard)  Physical Exam   Alert and well-appearing Unlabored respirations Abdomen with a moderately large area of periumbilical erythema. There is an area of firmness felt to the left and superior to the umbilicus. It is tender to deep palpation and uncomfortable for the patient. No obvious area of fluctuance or drainage (see photo below)      Assessment & Plan  S/P UMBILICAL HERNIA REPAIR, FOLLOW-UP EXAM (Z09) He is presenting to the office with redness, pain, and firmness near his umbilicus x 3 days, status post primary repair of umbilical hernia repair 3 years ago. On exam, it is difficult to tell if he has an incarcerated recurrent umbilical hernia or if there is an abscess in the area. Dr. Johney Maine also evaluated the  patient and recommended starting antibiotics and obtaining a CT scan as soon as possible. CT is scheduled for 6:30 PM tonight. He appears stable,  without fevers and vomiting. We will let him know what the CT scan shows. ER precautions were given and he provided understanding.  Addendum: CT shows recurrent umbilical hernia, without bowel involvement, but may represent incarceration or strangulation.  Dr. Barry Dienes is aware and has agreed to offering on him later today.  Patient has been posted on OR schedule - short stay will notify patient about arrival time.  IMPRESSION: 1. Umbilical hernia contains fat with inflammatory fat stranding of the herniated omental fat as well as adjacent skin and subcutaneous soft tissues. No bowel involvement. This may represent incarceration or strangulation. 2. Hepatic steatosis. Partially exophytic 9.8 x 4.1 x 5.3 cm lesion arising from the right liver tip with peripheral puddling of contrast, most consistent with hemangioma, approaching size criteria for giant hemangioma. Consider elective surgical referral.   Signed by Kellie Shropshire, PA C (12/09/2020 9:51AM  PM)

## 2020-12-09 NOTE — Op Note (Signed)
(  Open) primary repair of incarcerated umbilical hernia  Indications: The patient presented with a history of an incarcerated umbilical hernia with cellulitis  Pre-operative Diagnosis: incarcerated umbilical hernia with omentum.    Post-operative Diagnosis: same  Surgeon: Stark Klein   Anesthesia: General endotracheal anesthesia and Local anesthesia 1% plain lidocaine, 0.25.% bupivacaine, with epinephrine  ASA Class: 2  Procedure Details  The patient was seen again in the Holding Room. The risks, benefits, complications, treatment options, and expected outcomes were discussed with the patient. The possibilities of reaction to medication, pulmonary aspiration, perforation of viscus, bleeding, recurrent infection, the need for additional procedures, and development of a complication requiring transfusion or further operation were discussed with the patient and/or family. The likelihood of success in repairing the hernia and returning the patient to their previous functional status is good.  There was concurrence with the proposed plan, and informed consent was obtained.  The patient was taken to the Operating Room, identified as Scott Turner, and the procedure verified as umbilical hernia repair. A Time Out was held and the above information confirmed.  The patient was placed in the supine position and underwent induction of anesthesia. The abdomen was prepped with Chloraprep and draped in the standard fashion.  A curvilinear incision was made from just above the umbilicus to just below the umbilicus.  The prior scar tissue was divided with the cautery down to the fascia.  The hernia sac was identified and was dissected away from the umbilical skin.  The hernia sac was opened and ischemic omentum was identified.  This was clamped and tied off and then divided.  The viable omental stalk was allowed to drop back into the abdomen.  The hernia sac was quite inflamed and thickened, so this was also  resected.    The undersurface of the fascia was then examined.  No small bowel was adherent to the fascial edge.  The omentum was bluntly taken down from the fascial edges.  Interrupted #1 novofil pops were used to close the defect.  The fascia was pulled up with the suture to pull away the sutures from any underlying tissue.  The sutures were then tied down.  There was no residual palpable fascial defect.    A 1/4 inch penrose was placed in the wound, exiting the superior and inferior aspect of the incision.  A 2-0 vicryl was used to suture down the umbilical skin.  3-0 Vicryl was used to close the subcutaneous tissues and 4-0 Monocryl was used to close the skin in subcuticular fashion.  Benzoin and steri-strips were used to seal the incision. The penrose was secured at each end of the incision with a single 3-0 nylon. There was around 5-8 mm at each end of the incision to allow drainage.      A clean dressing was applied.  The patient was then extubated and brought to the recovery room in stable condition.  All sponge, instrument, and needle counts were correct prior to closure and at the conclusion of the case.   Estimated Blood Loss: less than 50 mL           Findings:  Incarcerated ischemic omentum.  Defect approximately 1 cm.         Complications: None; patient tolerated the procedure well.         Disposition: PACU - hemodynamically stable.         Condition: stable

## 2020-12-09 NOTE — Transfer of Care (Signed)
Immediate Anesthesia Transfer of Care Note  Patient: Scott Turner  Procedure(s) Performed: HERNIA REPAIR UMBILICAL ADULT (N/A )  Patient Location: PACU  Anesthesia Type:General  Level of Consciousness: awake, alert , oriented and patient cooperative  Airway & Oxygen Therapy: Patient Spontanous Breathing and Patient connected to face mask oxygen  Post-op Assessment: Report given to RN and Post -op Vital signs reviewed and stable  Post vital signs: Reviewed and stable  Last Vitals:  Vitals Value Taken Time  BP 177/100 12/09/20 1552  Temp    Pulse 96 12/09/20 1553  Resp 13 12/09/20 1553  SpO2 100 % 12/09/20 1553  Vitals shown include unvalidated device data.  Last Pain:  Vitals:   12/09/20 1201  TempSrc:   PainSc: 1       Patients Stated Pain Goal: 1 (64/84/72 0721)  Complications: No complications documented.

## 2020-12-09 NOTE — H&P (Deleted)
  The note originally documented on this encounter has been moved the the encounter in which it belongs.  

## 2020-12-09 NOTE — Anesthesia Procedure Notes (Signed)
Procedure Name: Intubation Date/Time: 12/09/2020 2:23 PM Performed by: [place] Anyi Fels C, CRNA Pre-anesthesia Checklist: Patient identified, Emergency Drugs available, Suction available and Patient being monitored Patient Re-evaluated:Patient Re-evaluated prior to induction Oxygen Delivery Method: Circle system utilized Preoxygenation: Pre-oxygenation with 100% oxygen Induction Type: IV induction Ventilation: Mask ventilation without difficulty Laryngoscope Size: Mac and 4 Grade View: Grade I Tube type: Oral Tube size: 7.5 mm Number of attempts: 1 Airway Equipment and Method: Stylet and Oral airway Placement Confirmation: ETT inserted through vocal cords under direct vision,  positive ETCO2 and breath sounds checked- equal and bilateral Secured at: 22 cm Tube secured with: Tape Dental Injury: Teeth and Oropharynx as per pre-operative assessment

## 2020-12-10 ENCOUNTER — Encounter (HOSPITAL_COMMUNITY): Payer: Self-pay | Admitting: General Surgery

## 2020-12-10 LAB — SURGICAL PATHOLOGY

## 2021-01-01 ENCOUNTER — Ambulatory Visit: Payer: BC Managed Care – PPO | Admitting: Family Medicine

## 2021-01-01 ENCOUNTER — Telehealth: Payer: Self-pay | Admitting: Family Medicine

## 2021-01-01 NOTE — Telephone Encounter (Signed)
Patient left vm at 8:36 to cancel appt. He has a fever.

## 2021-02-18 ENCOUNTER — Ambulatory Visit: Payer: BC Managed Care – PPO | Admitting: Physician Assistant

## 2021-02-18 ENCOUNTER — Ambulatory Visit: Payer: BC Managed Care – PPO | Admitting: Family Medicine

## 2021-02-18 ENCOUNTER — Other Ambulatory Visit: Payer: Self-pay

## 2021-02-18 ENCOUNTER — Encounter: Payer: Self-pay | Admitting: Physician Assistant

## 2021-02-18 VITALS — BP 157/89 | HR 76 | Wt 273.0 lb

## 2021-02-18 DIAGNOSIS — I10 Essential (primary) hypertension: Secondary | ICD-10-CM

## 2021-02-18 MED ORDER — LISINOPRIL 10 MG PO TABS: 10.0000 mg | ORAL_TABLET | Freq: Every day | ORAL | 0 refills | Status: DC

## 2021-02-18 NOTE — Progress Notes (Signed)
   Subjective:    Patient ID: Scott Turner, male    DOB: February 19, 1976, 45 y.o.   MRN: [id number]  HPI  Pt is a 45 yo obese male with HTN who presents to the clinic to discuss medication.   He has a personal hx of HTN and strong family hx of HTN. He has never tried medication. He recently had hernia repair surgery and every follow up BP has been elevated. He denies any CP, palpitations, headaches, vision changes.   No current smoking but history of smoking.   He does have some social anxiety and feels nervous when he is around people.   Has not been checking BP at home.   .. Active Ambulatory Problems    Diagnosis Date Noted  . Neoplasm of uncertain behavior of skin 10/05/2018  . Acrochordon 10/05/2018  . Rosacea 10/05/2018  . Essential hypertension 07/16/2019  . Pre-diabetes 09/10/2020  . Umbilical pain 68/10/5725  . Recurrent umbilical hernia 20/35/5974   Resolved Ambulatory Problems    Diagnosis Date Noted  . Elevated blood pressure reading 07/02/2019  . Acute intractable headache 07/02/2019   Past Medical History:  Diagnosis Date  . Allergy   . Bronchitis   . Cancer [facility])       Review of Systems  All other systems reviewed and are negative.      Objective:   Physical Exam Vitals reviewed.  Constitutional:      Appearance: Normal appearance. He is obese.  Neck:     Vascular: No carotid bruit.  Cardiovascular:     Rate and Rhythm: Normal rate and regular rhythm.     Pulses: Normal pulses.     Heart sounds: Normal heart sounds. No murmur heard.   Pulmonary:     Effort: Pulmonary effort is normal.     Breath sounds: Normal breath sounds.  Musculoskeletal:     Right lower leg: No edema.     Left lower leg: No edema.  Neurological:     General: No focal deficit present.     Mental Status: He is alert.  Psychiatric:        Mood and Affect: Mood normal.           Assessment & Plan:  Marland KitchenMarland KitchenDiagnoses and all orders for this visit:  Essential  hypertension -     lisinopril (ZESTRIL) 10 MG tablet; Take 1 tablet (10 mg total) by mouth daily.   Patient has attempted lifestyle changes in the past and has not seemed to make much of a difference in his blood pressure.  He is ready to start medication.  Started lisinopril 10 mg.  Discussed side effects and warned him of any dry cough to call office.  Strongly encouraged him to get a blood pressure cuff and start checking his blood pressures throughout the day.  Keep a log for the next 2 weeks.  Make appointment with PCP Dr. Zigmund Daniel in 2 weeks to go over log and make sure this is the appropriate dose.  Reviewed lifestyle changes that could be made to hopefully decrease blood pressure readings.  Strongly suggested weight loss.  Blood pressure goal with his low risk is under 140/90.

## 2021-02-18 NOTE — Patient Instructions (Signed)
Managing Your Hypertension Hypertension, also called high blood pressure, is when the force of the blood pressing against the walls of the arteries is too strong. Arteries are blood vessels that carry blood from your heart throughout your body. Hypertension forces the heart to work harder to pump blood and may cause the arteries to become narrow or stiff. Understanding blood pressure readings Your personal target blood pressure may vary depending on your medical conditions, your age, and other factors. A blood pressure reading includes a higher number over a lower number. Ideally, your blood pressure should be below 120/80. You should know that:  The first, or top, number is called the systolic pressure. It is a measure of the pressure in your arteries as your heart beats.  The second, or bottom number, is called the diastolic pressure. It is a measure of the pressure in your arteries as the heart relaxes. Blood pressure is classified into four stages. Based on your blood pressure reading, your health care provider may use the following stages to determine what type of treatment you need, if any. Systolic pressure and diastolic pressure are measured in a unit called mmHg. Normal  Systolic pressure: below 120.  Diastolic pressure: below 80. Elevated  Systolic pressure: 120-129.  Diastolic pressure: below 80. Hypertension stage 1  Systolic pressure: 130-139.  Diastolic pressure: 80-89. Hypertension stage 2  Systolic pressure: 140 or above.  Diastolic pressure: 90 or above. How can this condition affect me? Managing your hypertension is an important responsibility. Over time, hypertension can damage the arteries and decrease blood flow to important parts of the body, including the brain, heart, and kidneys. Having untreated or uncontrolled hypertension can lead to:  A heart attack.  A stroke.  A weakened blood vessel (aneurysm).  Heart failure.  Kidney damage.  Eye  damage.  Metabolic syndrome.  Memory and concentration problems.  Vascular dementia. What actions can I take to manage this condition? Hypertension can be managed by making lifestyle changes and possibly by taking medicines. Your health care provider will help you make a plan to bring your blood pressure within a normal range. Nutrition  Eat a diet that is high in fiber and potassium, and low in salt (sodium), added sugar, and fat. An example eating plan is called the Dietary Approaches to Stop Hypertension (DASH) diet. To eat this way: ? Eat plenty of fresh fruits and vegetables. Try to fill one-half of your plate at each meal with fruits and vegetables. ? Eat whole grains, such as whole-wheat pasta, brown rice, or whole-grain bread. Fill about one-fourth of your plate with whole grains. ? Eat low-fat dairy products. ? Avoid fatty cuts of meat, processed or cured meats, and poultry with skin. Fill about one-fourth of your plate with lean proteins such as fish, chicken without skin, beans, eggs, and tofu. ? Avoid pre-made and processed foods. These tend to be higher in sodium, added sugar, and fat.  Reduce your daily sodium intake. Most people with hypertension should eat less than 1,500 mg of sodium a day.   Lifestyle  Work with your health care provider to maintain a healthy body weight or to lose weight. Ask what an ideal weight is for you.  Get at least 30 minutes of exercise that causes your heart to beat faster (aerobic exercise) most days of the week. Activities may include walking, swimming, or biking.  Include exercise to strengthen your muscles (resistance exercise), such as weight lifting, as part of your weekly exercise routine. Try   to do these types of exercises for 30 minutes at least 3 days a week.  Do not use any products that contain nicotine or tobacco, such as cigarettes, e-cigarettes, and chewing tobacco. If you need help quitting, ask your health care  provider.  Control any long-term (chronic) conditions you have, such as high cholesterol or diabetes.  Identify your sources of stress and find ways to manage stress. This may include meditation, deep breathing, or making time for fun activities.   Alcohol use  Do not drink alcohol if: ? Your health care provider tells you not to drink. ? You are pregnant, may be pregnant, or are planning to become pregnant.  If you drink alcohol: ? Limit how much you use to:  0-1 drink a day for women.  0-2 drinks a day for men. ? Be aware of how much alcohol is in your drink. In the [place] one drink equals one 12 oz bottle of beer (355 mL), one 5 oz glass of wine (148 mL), or one 1 oz glass of hard liquor (44 mL). Medicines Your health care provider may prescribe medicine if lifestyle changes are not enough to get your blood pressure under control and if:  Your systolic blood pressure is 130 or higher.  Your diastolic blood pressure is 80 or higher. Take medicines only as told by your health care provider. Follow the directions carefully. Blood pressure medicines must be taken as told by your health care provider. The medicine does not work as well when you skip doses. Skipping doses also puts you at risk for problems. Monitoring Before you monitor your blood pressure:  Do not smoke, drink caffeinated beverages, or exercise within 30 minutes before taking a measurement.  Use the bathroom and empty your bladder (urinate).  Sit quietly for at least 5 minutes before taking measurements. Monitor your blood pressure at home as told by your health care provider. To do this:  Sit with your back straight and supported.  Place your feet flat on the floor. Do not cross your legs.  Support your arm on a flat surface, such as a table. Make sure your upper arm is at heart level.  Each time you measure, take two or three readings one minute apart and record the results. You may also need to have your  blood pressure checked regularly by your health care provider.   General information  Talk with your health care provider about your diet, exercise habits, and other lifestyle factors that may be contributing to hypertension.  Review all the medicines you take with your health care provider because there may be side effects or interactions.  Keep all visits as told by your health care provider. Your health care provider can help you create and adjust your plan for managing your high blood pressure. Where to find more information  National Heart, Lung, and Blood Institute: www.nhlbi.nih.gov  American Heart Association: www.heart.org Contact a health care provider if:  You think you are having a reaction to medicines you have taken.  You have repeated (recurrent) headaches.  You feel dizzy.  You have swelling in your ankles.  You have trouble with your vision. Get help right away if:  You develop a severe headache or confusion.  You have unusual weakness or numbness, or you feel faint.  You have severe pain in your chest or abdomen.  You vomit repeatedly.  You have trouble breathing. These symptoms may represent a serious problem that is an emergency. Do not wait   to see if the symptoms will go away. Get medical help right away. Call your local emergency services (911 in the [place] Do not drive yourself to the hospital. Summary  Hypertension is when the force of blood pumping through your arteries is too strong. If this condition is not controlled, it may put you at risk for serious complications.  Your personal target blood pressure may vary depending on your medical conditions, your age, and other factors. For most people, a normal blood pressure is less than 120/80.  Hypertension is managed by lifestyle changes, medicines, or both.  Lifestyle changes to help manage hypertension include losing weight, eating a healthy, low-sodium diet, exercising more, stopping smoking, and  limiting alcohol. This information is not intended to replace advice given to you by your health care provider. Make sure you discuss any questions you have with your health care provider. Document Revised: 01/18/2020 Document Reviewed: 11/13/2019 Elsevier Patient Education  2021 [employer]  

## 2021-03-04 ENCOUNTER — Other Ambulatory Visit: Payer: Self-pay

## 2021-03-04 ENCOUNTER — Encounter: Payer: Self-pay | Admitting: Family Medicine

## 2021-03-04 ENCOUNTER — Ambulatory Visit: Payer: BC Managed Care – PPO | Admitting: Family Medicine

## 2021-03-04 DIAGNOSIS — I10 Essential (primary) hypertension: Secondary | ICD-10-CM | POA: Diagnosis not present

## 2021-03-04 MED ORDER — LISINOPRIL 20 MG PO TABS: 20.0000 mg | ORAL_TABLET | Freq: Every day | ORAL | 1 refills | Status: DC

## 2021-03-04 NOTE — Progress Notes (Signed)
Scott Turner - 45 y.o. male MRN [id number]  Date of birth: 03/04/1976  Subjective Chief Complaint  Patient presents with  . Hypertension    HPI Scott Turner is a 45 y.o. male here today for follow up of HTN.  He was seen a couple of weeks ago and started on lisinopril 10mg .  He has been checking blood pressures at home and readings range from 117-149/75-95 and tend to be higher in the afternoon.  He has not had any noticeable side effects related to medication.  He has worked on low sodium diet and cut out soda.  He denies chest pain, shortness of breath, palpitations, headache or vision changes.    ROS:  A comprehensive ROS was completed and negative except as noted per HPI  Allergies  Allergen Reactions  . Sulfa Antibiotics     Childhood allergy     Past Medical History:  Diagnosis Date  . Allergy   . Bronchitis    approx 1 month ago   . Cancer ([place])    basal cell carcinoma on neck  . Elevated blood pressure reading 07/02/2019    Past Surgical History:  Procedure Laterality Date  . bsal cell carcinoma removed from neck     . EYE SURGERY     lasik surgery bilateral   . TONSILLECTOMY    . UMBILICAL HERNIA REPAIR N/A 01/25/2017   Procedure: HERNIA REPAIR UMBILICAL ADULT;  Surgeon: Clovis Riley, MD;  Location: [facility];  Service: General;  Laterality: N/A;  . UMBILICAL HERNIA REPAIR N/A 12/09/2020   Procedure: HERNIA REPAIR UMBILICAL ADULT;  Surgeon: Stark Klein, MD;  Location: [facility];  Service: General;  Laterality: N/A;    Social History   Socioeconomic History  . Marital status: Married    Spouse name: Not on file  . Number of children: Not on file  . Years of education: Not on file  . Highest education level: Not on file  Occupational History  . Not on file  Tobacco Use  . Smoking status: Former Smoker    Packs/day: 1.00    Years: 10.00    Pack years: 10.00    Types: Cigarettes    Quit date: 09/19/2011    Years since quitting: 9.4  . Smokeless tobacco:  Never Used  Vaping Use  . Vaping Use: Never used  Substance and Sexual Activity  . Alcohol use: Yes    Comment: maybe 6 pack on weekends   . Drug use: No  . Sexual activity: Yes  Other Topics Concern  . Not on file  Social History Narrative  . Not on file   Social Determinants of Health   Financial Resource Strain: Not on file  Food Insecurity: Not on file  Transportation Needs: Not on file  Physical Activity: Not on file  Stress: Not on file  Social Connections: Not on file    Family History  Problem Relation Age of Onset  . Heart disease Mother   . Cancer Maternal Grandmother   . Cancer Maternal Grandfather   . Heart disease Paternal Grandmother   . Heart disease Paternal Grandfather     Health Maintenance  Topic Date Due  . Hepatitis C Screening  Never done  . HIV Screening  Never done  . INFLUENZA VACCINE  03/26/2021 (Originally 07/27/2020)  . COVID-19 Vaccine (1) 12/24/2021 (Originally 07/10/1989)  . TETANUS/TDAP  02/18/2022 (Originally 07/10/1996)  . HPV VACCINES  Aged Out     ----------------------------------------------------------------------------------------------------------------------------------------------------------------------------------------------------------------- Physical Exam BP 135/90  Pulse 69   Temp (!) 97.3 F (36.3 C)   Wt 272 lb 12.8 oz (123.7 kg)   SpO2 100%   BMI 37.00 kg/m   Physical Exam Constitutional:      Appearance: Normal appearance.  Eyes:     General: No scleral icterus. Cardiovascular:     Rate and Rhythm: Normal rate and regular rhythm.  Pulmonary:     Effort: Pulmonary effort is normal.     Breath sounds: Normal breath sounds.  Neurological:     General: No focal deficit present.     Mental Status: He is alert.  Psychiatric:        Mood and Affect: Mood normal.        Behavior: Behavior normal.      ------------------------------------------------------------------------------------------------------------------------------------------------------------------------------------------------------------------- Assessment and Plan  Essential hypertension BP remains elevated.  Will increase lisinopril to 20mg  daily.  Check renal function and potassium today.  Continue to work on diet and lifestyle change.  See me again in 3 months.    Meds ordered this encounter  Medications  . lisinopril (ZESTRIL) 20 MG tablet    Sig: Take 1 tablet (20 mg total) by mouth daily.    Dispense:  90 tablet    Refill:  1    Return in about 3 months (around 06/04/2021) for HTN.    This visit occurred during the SARS-CoV-2 public health emergency.  Safety protocols were in place, including screening questions prior to the visit, additional usage of staff PPE, and extensive cleaning of exam room while observing appropriate contact time as indicated for disinfecting solutions.

## 2021-03-04 NOTE — Assessment & Plan Note (Signed)
BP remains elevated.  Will increase lisinopril to 20mg  daily.  Check renal function and potassium today.  Continue to work on diet and lifestyle change.  See me again in 3 months.

## 2021-03-04 NOTE — Patient Instructions (Signed)
Increase lisinopril to 20mg  daily.  Have labs completed today.  Continue low sodium diet See me again in about 3 months.

## 2021-03-05 LAB — BASIC METABOLIC PANEL
BUN: 9 mg/dL (ref 7–25)
CO2: 23 mmol/L (ref 20–32)
Calcium: 9.6 mg/dL (ref 8.6–10.3)
Chloride: 105 mmol/L (ref 98–110)
Creat: 0.78 mg/dL (ref 0.60–1.35)
Glucose, Bld: 112 mg/dL — ABNORMAL HIGH (ref 65–99)
Potassium: 4.3 mmol/L (ref 3.5–5.3)
Sodium: 140 mmol/L (ref 135–146)

## 2021-06-04 ENCOUNTER — Ambulatory Visit: Payer: BC Managed Care – PPO | Admitting: Family Medicine

## 2021-06-09 ENCOUNTER — Other Ambulatory Visit: Payer: Self-pay

## 2021-06-09 ENCOUNTER — Encounter: Payer: Self-pay | Admitting: Family Medicine

## 2021-06-09 ENCOUNTER — Ambulatory Visit: Payer: BC Managed Care – PPO | Admitting: Family Medicine

## 2021-06-09 DIAGNOSIS — I10 Essential (primary) hypertension: Secondary | ICD-10-CM | POA: Diagnosis not present

## 2021-06-09 MED ORDER — LISINOPRIL 20 MG PO TABS
20.0000 mg | ORAL_TABLET | Freq: Every day | ORAL | 3 refills | Status: DC
Start: 2021-06-09 — End: 2021-11-26

## 2021-06-09 NOTE — Assessment & Plan Note (Signed)
Blood pressure is at goal at for age and co-morbidities.  I recommend continuation of lisinopril 20mg  daily.  Encouraged to continue to work on dietary and lifestyle change and congratulated on progress he has made so far.

## 2021-06-09 NOTE — Patient Instructions (Signed)
Great to see you today! Continue lisinopril at current dose.  See me again in 6 months.

## 2021-06-09 NOTE — Progress Notes (Signed)
Scott Turner - 45 y.o. male MRN [id number]  Date of birth: [birth year]  Subjective Chief Complaint  Patient presents with   Hypertension    HPI Scott Turner is a 45 y.o. male here today for follow up of HTN.  He reports that he has been doing well with increase in lisinopril.  He has also started exercising 4-5 days per week.  He feels good overall.  He denies chest pain, shortness of breath,palpitations, headache or vision changes.    ROS:  A comprehensive ROS was completed and negative except as noted per HPI  Allergies  Allergen Reactions   Sulfa Antibiotics     Childhood allergy     Past Medical History:  Diagnosis Date   Allergy    Bronchitis    approx 1 month ago    Cancer ([place])    basal cell carcinoma on neck   Elevated blood pressure reading 07/02/2019    Past Surgical History:  Procedure Laterality Date   bsal cell carcinoma removed from neck      EYE SURGERY     lasik surgery bilateral    TONSILLECTOMY     UMBILICAL HERNIA REPAIR N/A 01/25/2017   Procedure: HERNIA REPAIR UMBILICAL ADULT;  Surgeon: Clovis Riley, MD;  Location: [facility];  Service: General;  Laterality: N/A;   UMBILICAL HERNIA REPAIR N/A 12/09/2020   Procedure: HERNIA REPAIR UMBILICAL ADULT;  Surgeon: Stark Klein, MD;  Location: [facility];  Service: General;  Laterality: N/A;    Social History   Socioeconomic History   Marital status: Married    Spouse name: Not on file   Number of children: Not on file   Years of education: Not on file   Highest education level: Not on file  Occupational History   Not on file  Tobacco Use   Smoking status: Former    Packs/day: 1.00    Years: 10.00    Pack years: 10.00    Types: Cigarettes    Quit date: 09/19/2011    Years since quitting: 9.7   Smokeless tobacco: Never  Vaping Use   Vaping Use: Never used  Substance and Sexual Activity   Alcohol use: Yes    Comment: maybe 6 pack on weekends    Drug use: No   Sexual activity: Yes  Other Topics  Concern   Not on file  Social History Narrative   Not on file   Social Determinants of Health   Financial Resource Strain: Not on file  Food Insecurity: Not on file  Transportation Needs: Not on file  Physical Activity: Not on file  Stress: Not on file  Social Connections: Not on file    Family History  Problem Relation Age of Onset   Heart disease Mother    Cancer Maternal Grandmother    Cancer Maternal Grandfather    Heart disease Paternal Grandmother    Heart disease Paternal Grandfather     Health Maintenance  Topic Date Due   Pneumococcal Vaccine 4-8 Years old (1 - PCV) Never done   HIV Screening  Never done   Hepatitis C Screening  Never done   Zoster Vaccines- Shingrix (1 of 2) Never done   COVID-19 Vaccine (1) 12/24/2021 (Originally 07/10/1982)   TETANUS/TDAP  02/18/2022 (Originally 07/10/1996)   INFLUENZA VACCINE  07/27/2021   HPV VACCINES  Aged Out     ----------------------------------------------------------------------------------------------------------------------------------------------------------------------------------------------------------------- Physical Exam BP 128/76 (BP Location: Left Arm, Patient Position: Sitting, Cuff Size: Large)   Pulse 85  Ht 6' (1.829 m)   Wt 266 lb (120.7 kg)   SpO2 99%   BMI 36.08 kg/m   Physical Exam Constitutional:      Appearance: Normal appearance.  HENT:     Head: Normocephalic and atraumatic.  Eyes:     General: No scleral icterus. Cardiovascular:     Rate and Rhythm: Normal rate and regular rhythm.  Pulmonary:     Effort: Pulmonary effort is normal.     Breath sounds: Normal breath sounds.  Musculoskeletal:     Cervical back: Neck supple.  Neurological:     General: No focal deficit present.     Mental Status: He is alert.  Psychiatric:        Mood and Affect: Mood normal.        Behavior: Behavior normal.     ------------------------------------------------------------------------------------------------------------------------------------------------------------------------------------------------------------------- Assessment and Plan  Essential hypertension Blood pressure is at goal at for age and co-morbidities.  I recommend continuation of lisinopril 20mg  daily.  Encouraged to continue to work on dietary and lifestyle change and congratulated on progress he has made so far.       Meds ordered this encounter  Medications   lisinopril (ZESTRIL) 20 MG tablet    Sig: Take 1 tablet (20 mg total) by mouth daily.    Dispense:  90 tablet    Refill:  3    Return in about 6 months (around 12/09/2021) for HTN/Labs.    This visit occurred during the SARS-CoV-2 public health emergency.  Safety protocols were in place, including screening questions prior to the visit, additional usage of staff PPE, and extensive cleaning of exam room while observing appropriate contact time as indicated for disinfecting solutions.

## 2021-11-26 ENCOUNTER — Ambulatory Visit: Payer: BC Managed Care – PPO | Admitting: Family Medicine

## 2021-11-26 ENCOUNTER — Encounter: Payer: Self-pay | Admitting: Family Medicine

## 2021-11-26 ENCOUNTER — Other Ambulatory Visit: Payer: Self-pay

## 2021-11-26 VITALS — BP 126/84 | HR 61 | Ht 72.0 in | Wt 266.0 lb

## 2021-11-26 DIAGNOSIS — R7303 Prediabetes: Secondary | ICD-10-CM | POA: Diagnosis not present

## 2021-11-26 DIAGNOSIS — Z13220 Encounter for screening for lipoid disorders: Secondary | ICD-10-CM | POA: Diagnosis not present

## 2021-11-26 DIAGNOSIS — I10 Essential (primary) hypertension: Secondary | ICD-10-CM | POA: Diagnosis not present

## 2021-11-26 MED ORDER — LISINOPRIL 20 MG PO TABS: 20.0000 mg | ORAL_TABLET | Freq: Every day | ORAL | 3 refills | Status: DC

## 2021-11-26 NOTE — Assessment & Plan Note (Signed)
His blood pressure remains well controlled with lisinopril at current strength.  Recommend continuation.  Updating labs today.

## 2021-11-26 NOTE — Progress Notes (Signed)
Scott Turner - 45 y.o. male MRN [id number]  Date of birth: January 29, 1976  Subjective Chief Complaint  Patient presents with   Follow-up    HPI Scott Turner is a 45 year old male here today for follow-up of hypertension.  Reports he is doing well with lisinopril at current strength.  No side effects noted with medication.  He has not had chest pain, shortness of breath, palpitations, headaches or vision changes.  ROS:  A comprehensive ROS was completed and negative except as noted per HPI  Allergies  Allergen Reactions   Sulfa Antibiotics     Childhood allergy     Past Medical History:  Diagnosis Date   Allergy    Bronchitis    approx 1 month ago    Cancer ([place])    basal cell carcinoma on neck   Elevated blood pressure reading 07/02/2019    Past Surgical History:  Procedure Laterality Date   bsal cell carcinoma removed from neck      EYE SURGERY     lasik surgery bilateral    TONSILLECTOMY     UMBILICAL HERNIA REPAIR N/A 01/25/2017   Procedure: HERNIA REPAIR UMBILICAL ADULT;  Surgeon: Clovis Riley, MD;  Location: [facility];  Service: General;  Laterality: N/A;   UMBILICAL HERNIA REPAIR N/A 12/09/2020   Procedure: HERNIA REPAIR UMBILICAL ADULT;  Surgeon: Stark Klein, MD;  Location: [facility];  Service: General;  Laterality: N/A;    Social History   Socioeconomic History   Marital status: Married    Spouse name: Not on file   Number of children: Not on file   Years of education: Not on file   Highest education level: Not on file  Occupational History   Not on file  Tobacco Use   Smoking status: Former    Packs/day: 1.00    Years: 10.00    Pack years: 10.00    Types: Cigarettes    Quit date: 09/19/2011    Years since quitting: 10.1   Smokeless tobacco: Never  Vaping Use   Vaping Use: Never used  Substance and Sexual Activity   Alcohol use: Yes    Comment: maybe 6 pack on weekends    Drug use: No   Sexual activity: Yes  Other Topics Concern   Not on file  Social  History Narrative   Not on file   Social Determinants of Health   Financial Resource Strain: Not on file  Food Insecurity: Not on file  Transportation Needs: Not on file  Physical Activity: Not on file  Stress: Not on file  Social Connections: Not on file    Family History  Problem Relation Age of Onset   Heart disease Mother    Cancer Maternal Grandmother    Cancer Maternal Grandfather    Heart disease Paternal Grandmother    Heart disease Paternal Grandfather     Health Maintenance  Topic Date Due   HIV Screening  Never done   Hepatitis C Screening  Never done   COVID-19 Vaccine (1) 12/24/2021 (Originally 01/10/1978)   TETANUS/TDAP  02/18/2022 (Originally 07/10/1996)   INFLUENZA VACCINE  03/26/2022 (Originally 07/27/2021)   Pneumococcal Vaccine 38-76 Years old (1 - PCV) 11/26/2022 (Originally 07/11/1983)   HPV VACCINES  Aged Out     ----------------------------------------------------------------------------------------------------------------------------------------------------------------------------------------------------------------- Physical Exam BP 126/84 (BP Location: Left Arm, Patient Position: Sitting, Cuff Size: Large)   Pulse 61   Ht 6' (1.829 m)   Wt 266 lb (120.7 kg)   SpO2 99%   BMI 36.08  kg/m   Physical Exam Constitutional:      Appearance: Normal appearance.  Eyes:     General: No scleral icterus. Cardiovascular:     Rate and Rhythm: Normal rate and regular rhythm.  Pulmonary:     Effort: Pulmonary effort is normal.     Breath sounds: Normal breath sounds.  Musculoskeletal:     Cervical back: Neck supple.  Neurological:     General: No focal deficit present.     Mental Status: He is alert.  Psychiatric:        Mood and Affect: Mood normal.        Behavior: Behavior normal.     ------------------------------------------------------------------------------------------------------------------------------------------------------------------------------------------------------------------- Assessment and Plan  Essential hypertension His blood pressure remains well controlled with lisinopril at current strength.  Recommend continuation.  Updating labs today.  Pre-diabetes Update A1c today.   Meds ordered this encounter  Medications   lisinopril (ZESTRIL) 20 MG tablet    Sig: Take 1 tablet (20 mg total) by mouth daily.    Dispense:  90 tablet    Refill:  3    Return in about 6 months (around 05/27/2022) for HTN.    This visit occurred during the SARS-CoV-2 public health emergency.  Safety protocols were in place, including screening questions prior to the visit, additional usage of staff PPE, and extensive cleaning of exam room while observing appropriate contact time as indicated for disinfecting solutions.

## 2021-11-26 NOTE — Assessment & Plan Note (Signed)
Update A1c today 

## 2021-11-27 LAB — COMPLETE METABOLIC PANEL WITH GFR
AG Ratio: 1.5 (calc) (ref 1.0–2.5)
ALT: 38 U/L (ref 9–46)
AST: 19 U/L (ref 10–40)
Albumin: 4.3 g/dL (ref 3.6–5.1)
Alkaline phosphatase (APISO): 98 U/L (ref 36–130)
BUN: 10 mg/dL (ref 7–25)
CO2: 25 mmol/L (ref 20–32)
Calcium: 9 mg/dL (ref 8.6–10.3)
Chloride: 105 mmol/L (ref 98–110)
Creat: 0.73 mg/dL (ref 0.60–1.29)
Globulin: 2.8 g/dL (calc) (ref 1.9–3.7)
Glucose, Bld: 99 mg/dL (ref 65–99)
Potassium: 4.6 mmol/L (ref 3.5–5.3)
Sodium: 139 mmol/L (ref 135–146)
Total Bilirubin: 0.9 mg/dL (ref 0.2–1.2)
Total Protein: 7.1 g/dL (ref 6.1–8.1)
eGFR: 115 mL/min/{1.73_m2} (ref >=60)

## 2021-11-27 LAB — HEMOGLOBIN A1C
Hgb A1c MFr Bld: 5.8 % of total Hgb — ABNORMAL HIGH (ref <5.7)
Mean Plasma Glucose: 120 mg/dL
eAG (mmol/L): 6.6 mmol/L

## 2021-11-27 LAB — LIPID PANEL W/REFLEX DIRECT LDL
Cholesterol: 175 mg/dL (ref <200)
HDL: 39 mg/dL — ABNORMAL LOW (ref >=40)
LDL Cholesterol (Calc): 114 mg/dL (calc) — ABNORMAL HIGH
Non-HDL Cholesterol (Calc): 136 mg/dL (calc) — ABNORMAL HIGH (ref <130)
Total CHOL/HDL Ratio: 4.5 (calc) (ref <5.0)
Triglycerides: 114 mg/dL (ref <150)

## 2021-11-27 LAB — CBC WITH DIFFERENTIAL/PLATELET
Absolute Monocytes: 722 cells/uL (ref 200–950)
Basophils Absolute: 53 cells/uL (ref 0–200)
Basophils Relative: 0.7 %
Eosinophils Absolute: 403 cells/uL (ref 15–500)
Eosinophils Relative: 5.3 %
HCT: 44.8 % (ref 38.5–50.0)
Hemoglobin: 15.2 g/dL (ref 13.2–17.1)
Lymphs Abs: 1877 cells/uL (ref 850–3900)
MCH: 29.6 pg (ref 27.0–33.0)
MCHC: 33.9 g/dL (ref 32.0–36.0)
MCV: 87.3 fL (ref 80.0–100.0)
MPV: 9.7 fL (ref 7.5–12.5)
Monocytes Relative: 9.5 %
Neutro Abs: 4545 cells/uL (ref 1500–7800)
Neutrophils Relative %: 59.8 %
Platelets: 286 10*3/uL (ref 140–400)
RBC: 5.13 10*6/uL (ref 4.20–5.80)
RDW: 12.8 % (ref 11.0–15.0)
Total Lymphocyte: 24.7 %
WBC: 7.6 10*3/uL (ref 3.8–10.8)

## 2022-03-10 IMAGING — CT CT ABD-PELV W/ CM
2 of 5 series · 15 of 46 positions shown, 17 images · IV contrast (omnipaque)
Comparison: None.

CLINICAL DATA: Umbilical pain for 3 days. Prior umbilical hernia
repair.

EXAM:
CT ABDOMEN AND PELVIS WITH CONTRAST
TECHNIQUE: Multidetector CT imaging of the abdomen and pelvis was performed
using the standard protocol following bolus administration of
intravenous contrast.
CONTRAST:  100mL OMNIPAQUE IOHEXOL 300 MG/ML  SOLN

[Series 2: axial st · axial · 0.78mm/px · z∈[-499,-79]mm · 12 of 98 slices shown, 14 images]
[im 7/98  soft-tissue]
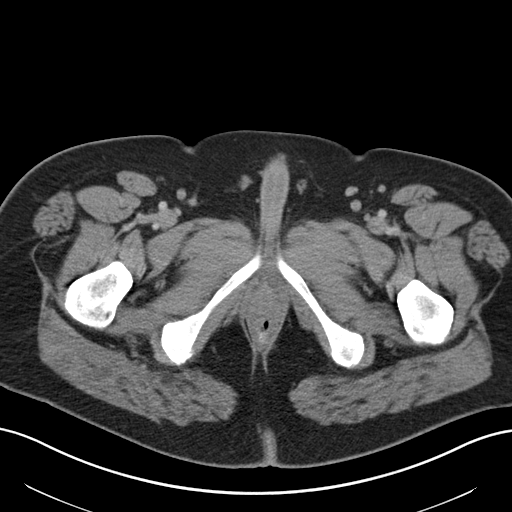
[im 7/98  bone]
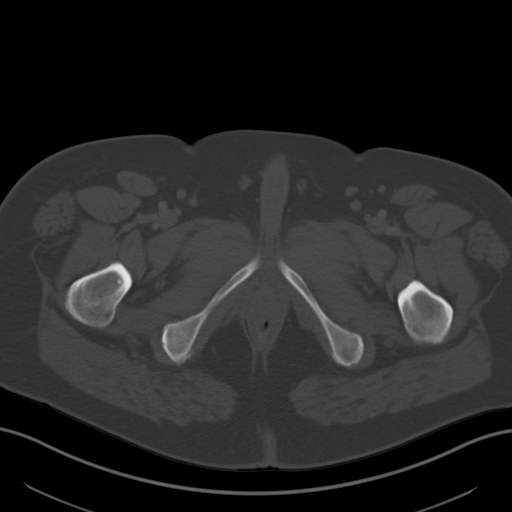
[im 14/98  soft-tissue]
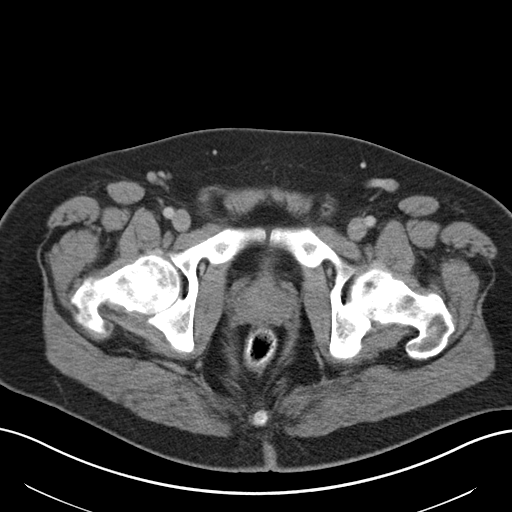
[im 21/98  soft-tissue]
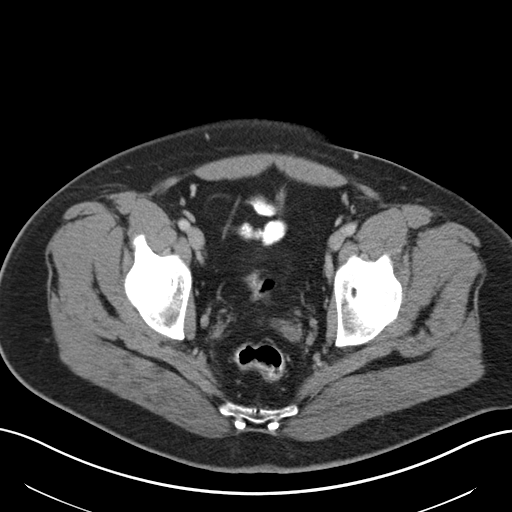
[im 28/98  soft-tissue]
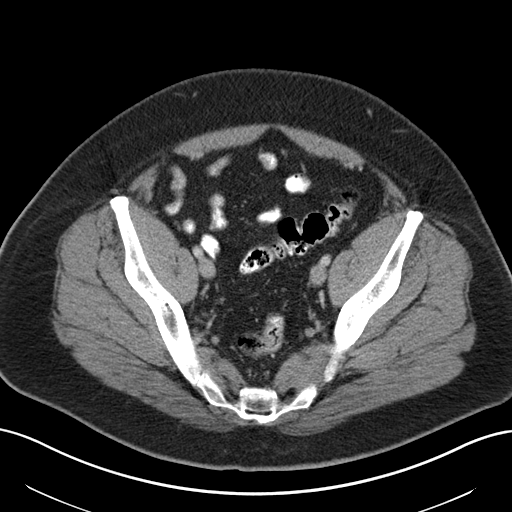
[im 35/98  soft-tissue]
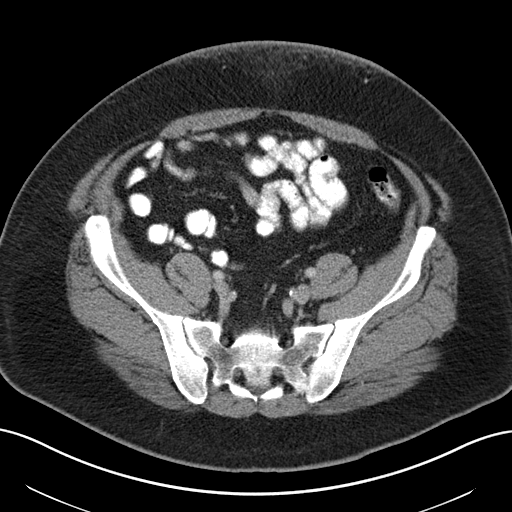
[im 42/98  soft-tissue]
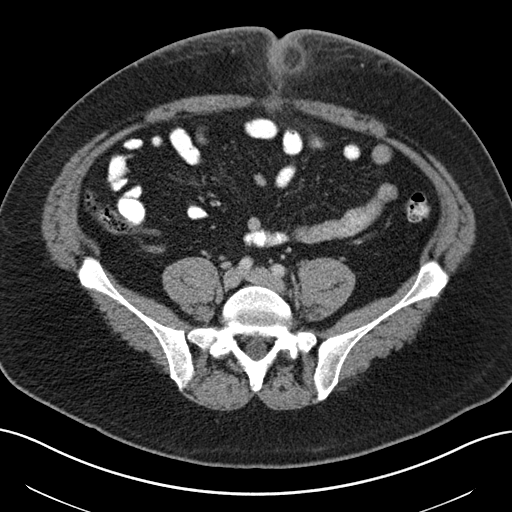
[im 56/98  soft-tissue]
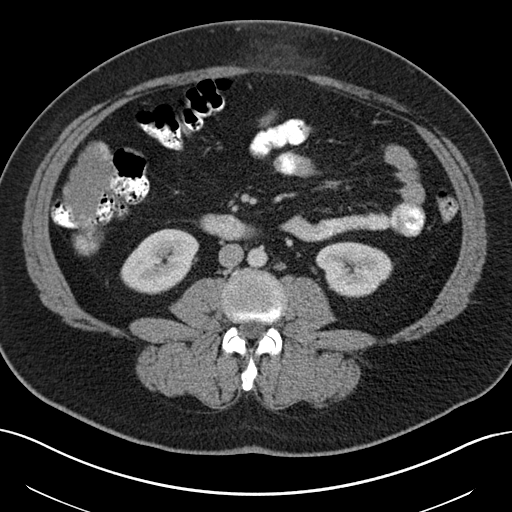
[im 63/98  soft-tissue]
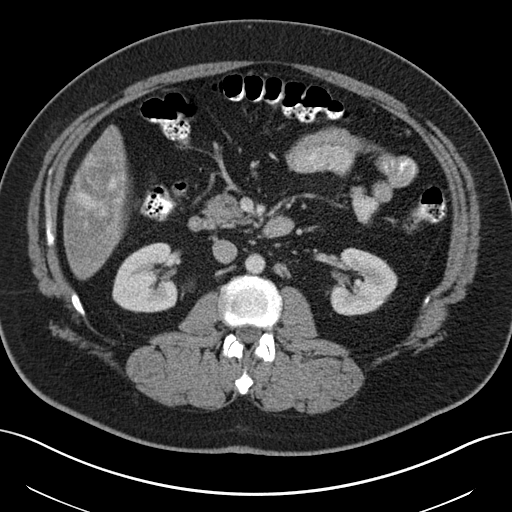
[im 70/98  soft-tissue]
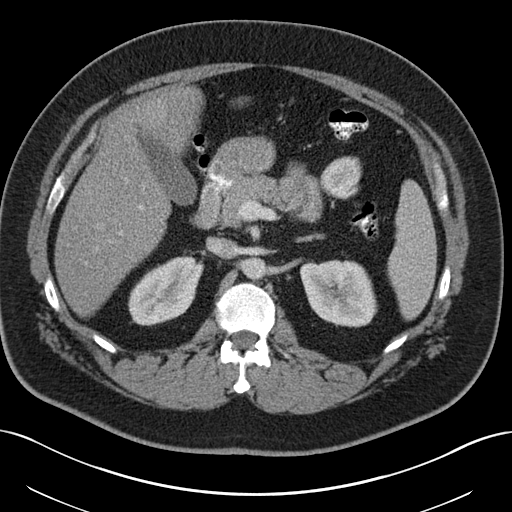
[im 70/98  bone]
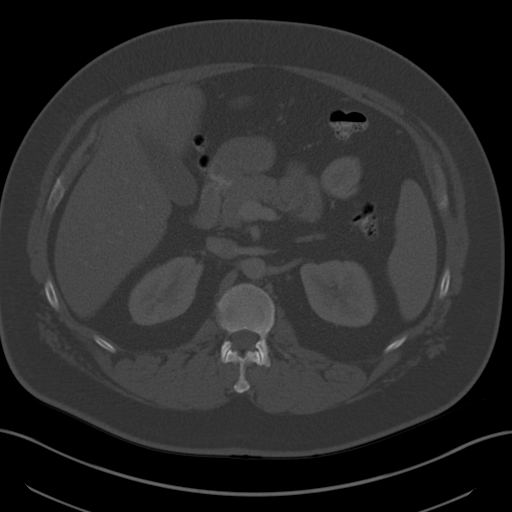
[im 77/98  soft-tissue]
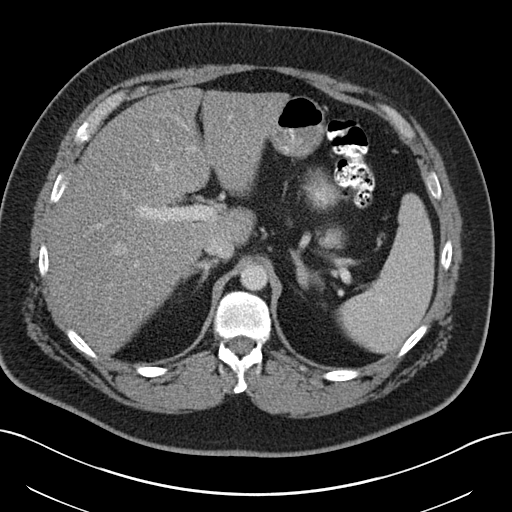
[im 84/98  soft-tissue]
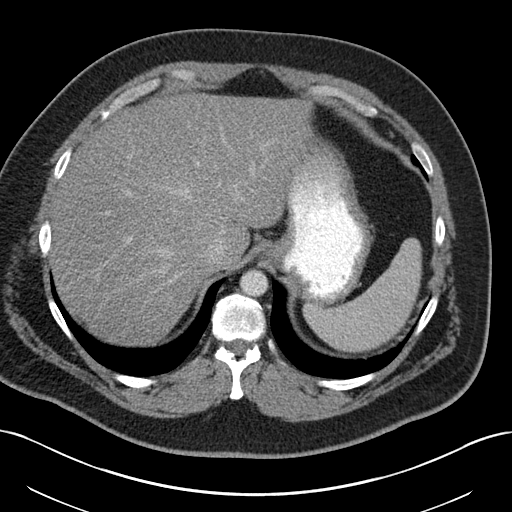
[im 91/98  soft-tissue]
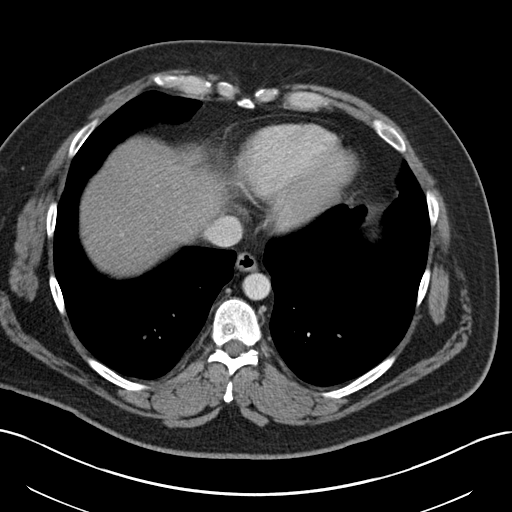

[Series 5: coronal st · coronal · 0.85mm/px · 3 of 170 slices shown]
[im 57/170  soft-tissue]
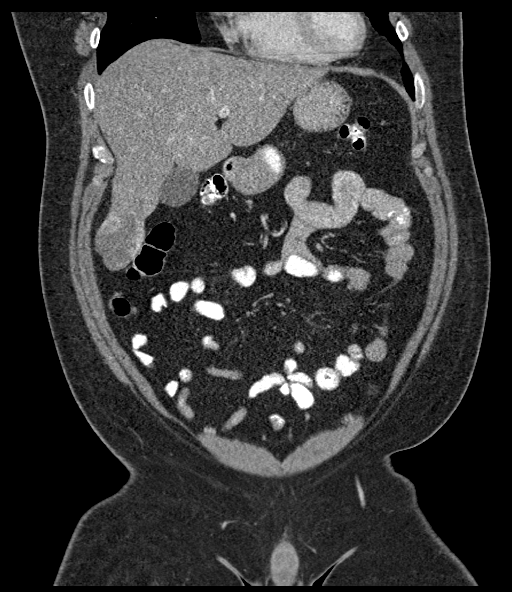
[im 76/170  soft-tissue]
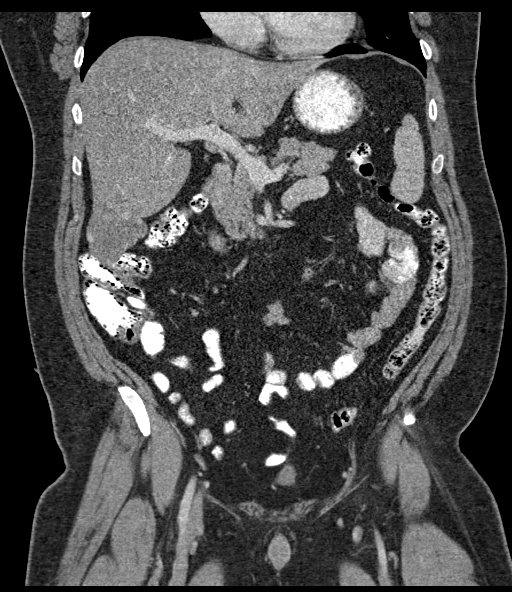
[im 94/170  soft-tissue]
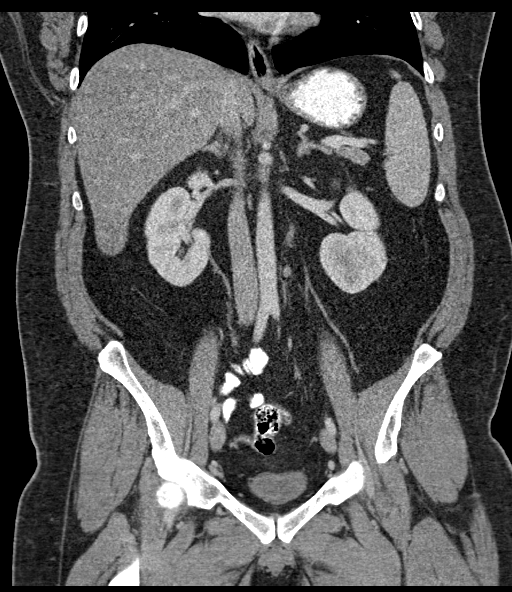

[15 of 46 positions shown; findings below may reference images not displayed]

FINDINGS: Lower chest: The lung bases are clear. No focal airspace disease or
pleural fluid.

Hepatobiliary: Diffusely decreased hepatic density consistent with
steatosis. There is a partially exophytic lesion arising from the
right liver tip measuring 9.8 x 4.1 x 5.3 cm with peripheral
puddling of contrast. This is fill-in on delayed phase imaging and
is most consistent with hemangioma. Gallbladder physiologically
distended, no calcified stone. No biliary dilatation.

Pancreas: No ductal dilatation or inflammation.

Spleen: Normal in size without focal abnormality.

Adrenals/Urinary Tract: Normal adrenal glands. No hydronephrosis or
perinephric edema. Homogeneous renal enhancement with symmetric
excretion on delayed phase imaging. Urinary bladder is only
minimally distended without wall thickening.

Stomach/Bowel: Stomach is unremarkable. Normal positioning of the
duodenum and ligament of Treitz. Normal small bowel without
obstruction, inflammation or wall thickening. Administered enteric
contrast is seen throughout the colon. The appendix is normal. Small
to moderate colonic stool burden. No colonic wall thickening or
inflammation.

Vascular/Lymphatic: Normal caliber abdominal aorta. Mild bi-iliac
atherosclerosis. Patent portal vein. No enlarged lymph nodes in the
abdomen or pelvis.

Reproductive: Prostate is unremarkable.

Other: Umbilical hernia contains fat with hernia neck measuring 16 x
11 mm. There is inflammatory fat stranding of the herniated omental
fat as well as adjacent skin and subcutaneous soft tissues. No bowel
involvement. The subjacent omental fat is mildly inflamed. No
ascites or free air. No inguinal or additional body wall hernia.

Musculoskeletal: Vertebral body hemangioma within T10, T11, and L1.
Bone island in right twelfth rib. There are no acute or suspicious
osseous abnormalities.
IMPRESSION: 1. Umbilical hernia contains fat with inflammatory fat stranding of
the herniated omental fat as well as adjacent skin and subcutaneous
soft tissues. No bowel involvement. This may represent incarceration
or strangulation.
2. Hepatic steatosis. Partially exophytic 9.8 x 4.1 x 5.3 cm lesion
arising from the right liver tip with peripheral puddling of
contrast, most consistent with hemangioma, approaching size criteria
for giant hemangioma. Consider elective surgical referral.

Aortic Atherosclerosis ([id number].P).

These results will be called to the ordering clinician or
representative by the Radiologist Assistant, and communication
documented in the PACS or [REDACTED].

## 2022-09-09 ENCOUNTER — Ambulatory Visit (INDEPENDENT_AMBULATORY_CARE_PROVIDER_SITE_OTHER): Payer: BC Managed Care – PPO | Admitting: Family Medicine

## 2022-09-09 ENCOUNTER — Encounter: Payer: Self-pay | Admitting: Family Medicine

## 2022-09-09 DIAGNOSIS — B356 Tinea cruris: Secondary | ICD-10-CM

## 2022-09-09 MED ORDER — FLUCONAZOLE 150 MG PO TABS: 150.0000 mg | ORAL_TABLET | ORAL | 0 refills | Status: AC

## 2022-09-09 MED ORDER — TRIAMCINOLONE ACETONIDE 0.1 % EX CREA: 1.0000 | TOPICAL_CREAM | Freq: Two times a day (BID) | CUTANEOUS | 0 refills | Status: DC

## 2022-09-09 NOTE — Progress Notes (Signed)
Scott Turner - 46 y.o. male MRN [id number]  Date of birth: May 10, 1976  Subjective Chief Complaint  Patient presents with   Recurrent Skin Infections    HPI Scott Turner is a 46 y.o. male here today with complaint of groin itching.  He has had this off and on for several months.  He has tried several OTC topical medications without significant or lasting improvement.  He does have a burning sensation sometimes after scratching.  Denies bleeding or drainage.    ROS:  A comprehensive ROS was completed and negative except as noted per HPI  Allergies  Allergen Reactions   Sulfa Antibiotics     Childhood allergy     Past Medical History:  Diagnosis Date   Allergy    Bronchitis    approx 1 month ago    Cancer ([place])    basal cell carcinoma on neck   Elevated blood pressure reading 07/02/2019    Past Surgical History:  Procedure Laterality Date   bsal cell carcinoma removed from neck      EYE SURGERY     lasik surgery bilateral    TONSILLECTOMY     UMBILICAL HERNIA REPAIR N/A 01/25/2017   Procedure: HERNIA REPAIR UMBILICAL ADULT;  Surgeon: Clovis Riley, MD;  Location: [facility];  Service: General;  Laterality: N/A;   UMBILICAL HERNIA REPAIR N/A 12/09/2020   Procedure: HERNIA REPAIR UMBILICAL ADULT;  Surgeon: Stark Klein, MD;  Location: [facility];  Service: General;  Laterality: N/A;    Social History   Socioeconomic History   Marital status: Married    Spouse name: Not on file   Number of children: Not on file   Years of education: Not on file   Highest education level: Not on file  Occupational History   Not on file  Tobacco Use   Smoking status: Former    Packs/day: 1.00    Years: 10.00    Total pack years: 10.00    Types: Cigarettes    Quit date: 09/19/2011    Years since quitting: 10.9   Smokeless tobacco: Never  Vaping Use   Vaping Use: Never used  Substance and Sexual Activity   Alcohol use: Yes    Comment: maybe 6 pack on weekends    Drug use: No   Sexual  activity: Yes  Other Topics Concern   Not on file  Social History Narrative   Not on file   Social Determinants of Health   Financial Resource Strain: Not on file  Food Insecurity: Not on file  Transportation Needs: Not on file  Physical Activity: Not on file  Stress: Not on file  Social Connections: Not on file    Family History  Problem Relation Age of Onset   Heart disease Mother    Cancer Maternal Grandmother    Cancer Maternal Grandfather    Heart disease Paternal Grandmother    Heart disease Paternal Grandfather     Health Maintenance  Topic Date Due   COVID-19 Vaccine (1) 01/27/2023 (Originally 07/10/1982)   INFLUENZA VACCINE  03/27/2023 (Originally 07/27/2022)   COLONOSCOPY (Pts 45-69yr Insurance coverage will need to be confirmed)  09/10/2023 (Originally 07/10/2022)   TETANUS/TDAP  09/10/2023 (Originally 07/10/1996)   Hepatitis C Screening  09/10/2023 (Originally 07/11/1995)   HIV Screening  09/10/2023 (Originally 07/10/1992)   HPV VACCINES  Aged Out     ----------------------------------------------------------------------------------------------------------------------------------------------------------------------------------------------------------------- Physical Exam BP (!) 151/96 (BP Location: Left Arm, Patient Position: Sitting, Cuff Size: Large)   Pulse 73  Ht 6' (1.829 m)   Wt 266 lb 8 oz (120.9 kg)   SpO2 96%   BMI 36.14 kg/m   Physical Exam Constitutional:      Appearance: Normal appearance.  Skin:    Comments: Redness and flakiness in groin area.    Neurological:     Mental Status: He is alert.     ------------------------------------------------------------------------------------------------------------------------------------------------------------------------------------------------------------------- Assessment and Plan  Tinea cruris He has tried and failed several topical medications.  Adding fluconazole '150mg'$  weekly x 4 weeks and  triamcinolone cream to help with itching and inflammation.  Contact clinic if still not improving after completion.     Meds ordered this encounter  Medications   fluconazole (DIFLUCAN) 150 MG tablet    Sig: Take 1 tablet (150 mg total) by mouth once a week for 4 doses.    Dispense:  4 tablet    Refill:  0   triamcinolone cream (KENALOG) 0.1 %    Sig: Apply 1 Application topically 2 (two) times daily.    Dispense:  30 g    Refill:  0    No follow-ups on file.    This visit occurred during the SARS-CoV-2 public health emergency.  Safety protocols were in place, including screening questions prior to the visit, additional usage of staff PPE, and extensive cleaning of exam room while observing appropriate contact time as indicated for disinfecting solutions.

## 2022-09-09 NOTE — Patient Instructions (Signed)
Jock Itch Jock itch is an itchy rash in the groin and upper thigh area. It is a skin infection that is caused by a type of germ (fungus). The rash usually goes away in 2-3 weeks with treatment. What are the causes? This condition may be caused by: Touching a fungal infection elsewhere on your body, such as athlete's foot, and then touching your groin area. Sharing towels or clothing, such as socks or shoes, with someone who has a fungal infection. What increases the risk? This condition is most common in men and adolescent boys. You are also more likely to develop the condition if you: Are in a hot, humid climate. Wear tight-fitting clothes or wet bathing suits for a long time. Play sports. Are overweight. Have diabetes. Have a weakened body defense system (immune system). Sweat a lot. What are the signs or symptoms? Symptoms of this condition include: A red, pink, or brown rash in the groin area. There may be blisters. The rash may spread to your thighs, the opening between the butt (anus), and the butt. Dry and scaly skin on or around the rash. Itchiness. How is this treated? Treatment for this condition may include: Medicine to kill the fungus (antifungal). This may be one of the following: Skin cream. Ointment. Powder. Medicine that you take by mouth. Skin cream or ointment to reduce itching. Lifestyle changes, such as wearing looser clothing and caring for your skin. Follow these instructions at home: Skin care Use skin creams, ointments, or powders exactly as told by your doctor. Wear clothes that are loose. Clothes should not rub against your groin area. Men should wear boxer shorts or loose underwear. Keep your groin area clean and dry. Change your underwear every day. Change out of wet bathing suits as soon as you can. After bathing, use a different towel to dry your groin area. Dry the area gently and completely. Avoid hot baths and showers. Hot water can make itching  worse. Do not scratch the area. General instructions Take and apply over-the-counter and prescription medicines only as told by your doctor. Do not share towels or clothing with other people. Wash your hands often with soap and water for at least 20 seconds, especially after touching your groin area. If you cannot use soap and water, use hand sanitizer. When at the gym: Always wear shoes, especially in the shower and around the swimming pool. Keep any cuts covered. Clean any mats or equipment before using them. Shower as soon as you are done working out. Keep all follow-up visits. Contact a doctor if: Your rash: Gets worse. Does not get better after 2 weeks of treatment. Spreads. Comes back after treatment is done. You have a fever. You have new or more redness, swelling, or pain around your rash. You have fluid, blood, or pus coming from your rash. Summary Jock itch is an itchy rash. It affects the groin and upper thigh area. Jock itch usually goes away in 2-3 weeks with treatment. Keep your groin area clean and dry. This information is not intended to replace advice given to you by your health care provider. Make sure you discuss any questions you have with your health care provider. Document Revised: 03/03/2021 Document Reviewed: 03/03/2021 Elsevier Patient Education  [employer].

## 2022-09-09 NOTE — Assessment & Plan Note (Addendum)
He has tried and failed several topical medications.  Adding fluconazole '150mg'$  weekly x 4 weeks and triamcinolone cream to help with itching and inflammation.  Contact clinic if still not improving after completion.

## 2023-03-03 ENCOUNTER — Other Ambulatory Visit: Payer: Self-pay | Admitting: Family Medicine

## 2023-03-04 NOTE — Telephone Encounter (Signed)
Please contact the patient to schedule appt for medication refill. Pt was advised to let us know if symptoms weren't resolved or improving.

## 2023-03-08 ENCOUNTER — Other Ambulatory Visit: Payer: Self-pay

## 2023-03-08 DIAGNOSIS — I10 Essential (primary) hypertension: Secondary | ICD-10-CM

## 2023-03-08 MED ORDER — LISINOPRIL 20 MG PO TABS: 20.0000 mg | ORAL_TABLET | Freq: Every day | ORAL | 0 refills | Status: DC

## 2023-03-09 ENCOUNTER — Other Ambulatory Visit: Payer: Self-pay | Admitting: Family Medicine

## 2023-03-09 DIAGNOSIS — I10 Essential (primary) hypertension: Secondary | ICD-10-CM

## 2023-03-10 ENCOUNTER — Other Ambulatory Visit: Payer: Self-pay

## 2023-03-10 ENCOUNTER — Other Ambulatory Visit: Payer: Self-pay | Admitting: Family Medicine

## 2023-03-10 DIAGNOSIS — I10 Essential (primary) hypertension: Secondary | ICD-10-CM

## 2023-03-10 MED ORDER — LISINOPRIL 20 MG PO TABS: 20.0000 mg | ORAL_TABLET | Freq: Every day | ORAL | 0 refills | Status: DC

## 2023-03-11 ENCOUNTER — Other Ambulatory Visit: Payer: Self-pay

## 2023-03-15 ENCOUNTER — Encounter: Payer: Self-pay | Admitting: Family Medicine

## 2023-03-15 ENCOUNTER — Ambulatory Visit: Payer: BC Managed Care – PPO | Admitting: Family Medicine

## 2023-03-15 VITALS — BP 146/83 | HR 84 | Ht 72.0 in | Wt 270.0 lb

## 2023-03-15 DIAGNOSIS — B356 Tinea cruris: Secondary | ICD-10-CM | POA: Diagnosis not present

## 2023-03-15 NOTE — Assessment & Plan Note (Signed)
He continues to have significant itching in the groin area.  Improved with fluconazole previously but symptoms returned.  Skin scraping sent for KOH and fungal culture.

## 2023-03-15 NOTE — Progress Notes (Signed)
Scott Turner - 47 y.o. male MRN [id number]  Date of birth: [birth year]  Subjective Chief Complaint  Patient presents with   Recurrent Skin Infections    HPI Scott Turner is a 47 year old male here today with complaint of recurrent scrotal itching.  Had previously tried several topical antifungals without significant improvement.  Given 4-week course of fluconazole previously and had resolution for a few weeks however symptoms returned not too long afterwards.  He continues to have itching with a small nodule on the scrotum.  He denies any pain associated with this.  Denies urinary symptoms.  ROS:  A comprehensive ROS was completed and negative except as noted per HPI  Allergies  Allergen Reactions   Sulfa Antibiotics     Childhood allergy     Past Medical History:  Diagnosis Date   Allergy    Bronchitis    approx 1 month ago    Cancer ([place])    basal cell carcinoma on neck   Elevated blood pressure reading 07/02/2019    Past Surgical History:  Procedure Laterality Date   bsal cell carcinoma removed from neck      EYE SURGERY     lasik surgery bilateral    TONSILLECTOMY     UMBILICAL HERNIA REPAIR N/A 01/25/2017   Procedure: HERNIA REPAIR UMBILICAL ADULT;  Surgeon: Clovis Riley, MD;  Location: [facility];  Service: General;  Laterality: N/A;   UMBILICAL HERNIA REPAIR N/A 12/09/2020   Procedure: HERNIA REPAIR UMBILICAL ADULT;  Surgeon: Stark Klein, MD;  Location: [facility];  Service: General;  Laterality: N/A;    Social History   Socioeconomic History   Marital status: Married    Spouse name: Not on file   Number of children: Not on file   Years of education: Not on file   Highest education level: Not on file  Occupational History   Not on file  Tobacco Use   Smoking status: Former    Packs/day: 1.00    Years: 10.00    Additional pack years: 0.00    Total pack years: 10.00    Types: Cigarettes    Quit date: 09/19/2011    Years since quitting: 11.4   Smokeless tobacco:  Never  Vaping Use   Vaping Use: Never used  Substance and Sexual Activity   Alcohol use: Yes    Comment: maybe 6 pack on weekends    Drug use: No   Sexual activity: Yes  Other Topics Concern   Not on file  Social History Narrative   Not on file   Social Determinants of Health   Financial Resource Strain: Not on file  Food Insecurity: Not on file  Transportation Needs: Not on file  Physical Activity: Not on file  Stress: Not on file  Social Connections: Not on file    Family History  Problem Relation Age of Onset   Heart disease Mother    Cancer Maternal Grandmother    Cancer Maternal Grandfather    Heart disease Paternal Grandmother    Heart disease Paternal Grandfather     Health Maintenance  Topic Date Due   DTaP/Tdap/Td (1 - Tdap) Never done   INFLUENZA VACCINE  03/27/2023 (Originally 07/27/2022)   COLONOSCOPY (Pts 45-78yrs Insurance coverage will need to be confirmed)  09/10/2023 (Originally 07/10/2022)   Hepatitis C Screening  09/10/2023 (Originally 07/11/1995)   HIV Screening  09/10/2023 (Originally 07/10/1992)   COVID-19 Vaccine (1) 03/30/2024 (Originally 07/10/1982)   HPV VACCINES  Aged Out     -----------------------------------------------------------------------------------------------------------------------------------------------------------------------------------------------------------------  Physical Exam BP (!) 146/83 (BP Location: Left Arm, Patient Position: Sitting, Cuff Size: Large)   Pulse 84   Ht 6' (1.829 m)   Wt 270 lb (122.5 kg)   SpO2 100%   BMI 36.62 kg/m   Physical Exam Constitutional:      Appearance: Normal appearance.  HENT:     Head: Normocephalic and atraumatic.  Eyes:     General: No scleral icterus. Skin:    Comments: Erythema in the perineum and groin area.  Small nodule on the scrotum that is nontender.  Skin scraping performed.  Neurological:     Mental Status: He is alert.  Psychiatric:        Mood and Affect: Mood  normal.        Behavior: Behavior normal.     ------------------------------------------------------------------------------------------------------------------------------------------------------------------------------------------------------------------- Assessment and Plan  Tinea cruris He continues to have significant itching in the groin area.  Improved with fluconazole previously but symptoms returned.  Skin scraping sent for KOH and fungal culture.   No orders of the defined types were placed in this encounter.   No follow-ups on file.    This visit occurred during the SARS-CoV-2 public health emergency.  Safety protocols were in place, including screening questions prior to the visit, additional usage of staff PPE, and extensive cleaning of exam room while observing appropriate contact time as indicated for disinfecting solutions.

## 2023-03-24 ENCOUNTER — Encounter: Payer: Self-pay | Admitting: Family Medicine

## 2023-03-24 ENCOUNTER — Other Ambulatory Visit: Payer: Self-pay | Admitting: Family Medicine

## 2023-03-24 DIAGNOSIS — R21 Rash and other nonspecific skin eruption: Secondary | ICD-10-CM

## 2023-03-24 MED ORDER — CLINDAMYCIN PHOSPHATE 1 % EX GEL: Freq: Two times a day (BID) | CUTANEOUS | 1 refills | Status: DC

## 2023-04-14 DIAGNOSIS — L280 Lichen simplex chronicus: Secondary | ICD-10-CM | POA: Diagnosis not present

## 2023-04-14 LAB — CULT, FUNGUS, SKIN,HAIR,NAIL W/KOH
CULTURE:: NO GROWTH
MICRO NUMBER:: 14712169
SMEAR:: NONE SEEN
SPECIMEN QUALITY:: ADEQUATE

## 2023-06-05 ENCOUNTER — Other Ambulatory Visit: Payer: Self-pay | Admitting: Family Medicine

## 2023-06-05 DIAGNOSIS — I10 Essential (primary) hypertension: Secondary | ICD-10-CM

## 2023-07-14 DIAGNOSIS — L814 Other melanin hyperpigmentation: Secondary | ICD-10-CM | POA: Diagnosis not present

## 2023-07-14 DIAGNOSIS — L821 Other seborrheic keratosis: Secondary | ICD-10-CM | POA: Diagnosis not present

## 2023-07-14 DIAGNOSIS — L570 Actinic keratosis: Secondary | ICD-10-CM | POA: Diagnosis not present

## 2023-07-14 DIAGNOSIS — L280 Lichen simplex chronicus: Secondary | ICD-10-CM | POA: Diagnosis not present

## 2023-11-09 ENCOUNTER — Other Ambulatory Visit: Payer: Self-pay

## 2023-11-09 DIAGNOSIS — I10 Essential (primary) hypertension: Secondary | ICD-10-CM

## 2023-11-09 MED ORDER — LISINOPRIL 20 MG PO TABS: 20.0000 mg | ORAL_TABLET | Freq: Every day | ORAL | 0 refills | Status: DC

## 2023-11-09 NOTE — Telephone Encounter (Signed)
Patient has been scheduled

## 2023-11-09 NOTE — Telephone Encounter (Signed)
Pls contact the patient to schedule appt for HTN. Sending 30 day refill. Thanks

## 2023-11-15 ENCOUNTER — Ambulatory Visit: Payer: BC Managed Care – PPO | Admitting: Family Medicine

## 2023-11-15 ENCOUNTER — Encounter: Payer: Self-pay | Admitting: Family Medicine

## 2023-11-15 VITALS — BP 134/84 | HR 64 | Ht 72.0 in | Wt 276.0 lb

## 2023-11-15 DIAGNOSIS — Z13220 Encounter for screening for lipoid disorders: Secondary | ICD-10-CM

## 2023-11-15 DIAGNOSIS — L659 Nonscarring hair loss, unspecified: Secondary | ICD-10-CM | POA: Diagnosis not present

## 2023-11-15 DIAGNOSIS — R7303 Prediabetes: Secondary | ICD-10-CM | POA: Diagnosis not present

## 2023-11-15 DIAGNOSIS — I10 Essential (primary) hypertension: Secondary | ICD-10-CM

## 2023-11-15 MED ORDER — FINASTERIDE 5 MG PO TABS: ORAL_TABLET | ORAL | 1 refills | Status: DC

## 2023-11-15 MED ORDER — LISINOPRIL 20 MG PO TABS: 20.0000 mg | ORAL_TABLET | Freq: Every day | ORAL | 1 refills | Status: DC

## 2023-11-15 NOTE — Assessment & Plan Note (Signed)
His blood pressure remains well controlled with lisinopril at current strength.  Recommend continuation.  Updating labs today.

## 2023-11-15 NOTE — Assessment & Plan Note (Signed)
Update A1c today 

## 2023-11-15 NOTE — Assessment & Plan Note (Signed)
Add low dose finasteride.

## 2023-11-15 NOTE — Progress Notes (Signed)
Scott Turner - 47 y.o. male MRN [id number]  Date of birth: 02/09/1976  Subjective Chief Complaint  Patient presents with   Hypertension    HPI Scott Turner is a 47 y.o. male here today for follow up visit.   Remains on lisinopril for management of HTN.  BP is fairly well controlled.  He has incorporated some exercise as well as changes to his diet.  Hasn't really noted any weight loss.  He denies chest pain, shortness of breath, palpitations, headache or vision changes.   He is also concerned about male pattern hair loss.  Asks about oral minoxidil as an option.    ROS:  A comprehensive ROS was completed and negative except as noted per HPI  Allergies  Allergen Reactions   Sulfa Antibiotics     Childhood allergy     Past Medical History:  Diagnosis Date   Allergy    Bronchitis    approx 1 month ago    Cancer ([facility])    basal cell carcinoma on neck   Elevated blood pressure reading 07/02/2019    Past Surgical History:  Procedure Laterality Date   bsal cell carcinoma removed from neck      EYE SURGERY     lasik surgery bilateral    TONSILLECTOMY     UMBILICAL HERNIA REPAIR N/A 01/25/2017   Procedure: HERNIA REPAIR UMBILICAL ADULT;  Surgeon: Berna Bue, MD;  Location: [facility];  Service: General;  Laterality: N/A;   UMBILICAL HERNIA REPAIR N/A 12/09/2020   Procedure: HERNIA REPAIR UMBILICAL ADULT;  Surgeon: Almond Lint, MD;  Location: [facility];  Service: General;  Laterality: N/A;    Social History   Socioeconomic History   Marital status: Married    Spouse name: Not on file   Number of children: Not on file   Years of education: Not on file   Highest education level: Not on file  Occupational History   Not on file  Tobacco Use   Smoking status: Former    Current packs/day: 0.00    Average packs/day: 1 pack/day for 10.0 years (10.0 ttl pk-yrs)    Types: Cigarettes    Start date: 09/18/2001    Quit date: 09/19/2011    Years since quitting: 12.1   Smokeless  tobacco: Never  Vaping Use   Vaping status: Never Used  Substance and Sexual Activity   Alcohol use: Yes    Comment: maybe 6 pack on weekends    Drug use: No   Sexual activity: Yes  Other Topics Concern   Not on file  Social History Narrative   Not on file   Social Determinants of Health   Financial Resource Strain: Not on file  Food Insecurity: Not on file  Transportation Needs: Not on file  Physical Activity: Not on file  Stress: Not on file  Social Connections: Not on file    Family History  Problem Relation Age of Onset   Heart disease Mother    Cancer Maternal Grandmother    Cancer Maternal Grandfather    Heart disease Paternal Grandmother    Heart disease Paternal Grandfather     Health Maintenance  Topic Date Due   Colonoscopy  Never done   DTaP/Tdap/Td (1 - Tdap) 02/25/2024 (Originally 07/10/1996)   INFLUENZA VACCINE  03/26/2024 (Originally 07/28/2023)   COVID-19 Vaccine (1) 03/30/2024 (Originally 07/10/1982)   Hepatitis C Screening  11/14/2024 (Originally 07/11/1995)   HIV Screening  11/14/2024 (Originally 07/10/1992)   HPV VACCINES  Aged  Out     ----------------------------------------------------------------------------------------------------------------------------------------------------------------------------------------------------------------- Physical Exam BP 134/84 (BP Location: Left Arm, Patient Position: Sitting, Cuff Size: Large)   Pulse 64   Ht 6' (1.829 m)   Wt 276 lb (125.2 kg)   SpO2 100%   BMI 37.43 kg/m   Physical Exam Constitutional:      Appearance: Normal appearance.  Eyes:     General: No scleral icterus. Cardiovascular:     Rate and Rhythm: Normal rate and regular rhythm.  Neurological:     General: No focal deficit present.     Mental Status: He is alert.  Psychiatric:        Mood and Affect: Mood normal.        Behavior: Behavior normal.      ------------------------------------------------------------------------------------------------------------------------------------------------------------------------------------------------------------------- Assessment and Plan  Essential hypertension His blood pressure remains well controlled with lisinopril at current strength.  Recommend continuation.  Updating labs today.  Pre-diabetes Update A1c today.  Hair loss Add low dose finasteride.    Meds ordered this encounter  Medications   lisinopril (ZESTRIL) 20 MG tablet    Sig: Take 1 tablet (20 mg total) by mouth daily.    Dispense:  90 tablet    Refill:  1   finasteride (PROSCAR) 5 MG tablet    Sig: Take 1/2 tab po daily    Dispense:  45 tablet    Refill:  1    Return in about 6 months (around 05/14/2024) for Hypertension.    This visit occurred during the SARS-CoV-2 public health emergency.  Safety protocols were in place, including screening questions prior to the visit, additional usage of staff PPE, and extensive cleaning of exam room while observing appropriate contact time as indicated for disinfecting solutions.

## 2023-11-15 NOTE — Patient Instructions (Signed)
Take finasteride 1/2 tab daily for hair loss.   Continue lisinopril.

## 2023-11-16 LAB — CMP14+EGFR
ALT: 44 [IU]/L (ref 0–44)
AST: 21 [IU]/L (ref 0–40)
Albumin: 4.2 g/dL (ref 4.1–5.1)
Alkaline Phosphatase: 116 [IU]/L (ref 44–121)
BUN/Creatinine Ratio: 10 (ref 9–20)
BUN: 8 mg/dL (ref 6–24)
Bilirubin Total: 0.5 mg/dL (ref 0.0–1.2)
CO2: 21 mmol/L (ref 20–29)
Calcium: 9 mg/dL (ref 8.7–10.2)
Chloride: 105 mmol/L (ref 96–106)
Creatinine, Ser: 0.78 mg/dL (ref 0.76–1.27)
Globulin, Total: 2.7 g/dL (ref 1.5–4.5)
Glucose: 114 mg/dL — ABNORMAL HIGH (ref 70–99)
Potassium: 4.3 mmol/L (ref 3.5–5.2)
Sodium: 141 mmol/L (ref 134–144)
Total Protein: 6.9 g/dL (ref 6.0–8.5)
eGFR: 111 mL/min/{1.73_m2} (ref >59)

## 2023-11-16 LAB — LIPID PANEL WITH LDL/HDL RATIO
Cholesterol, Total: 193 mg/dL (ref 100–199)
HDL: 41 mg/dL (ref >39)
LDL Chol Calc (NIH): 128 mg/dL — ABNORMAL HIGH (ref 0–99)
LDL/HDL Ratio: 3.1 ratio (ref 0.0–3.6)
Triglycerides: 136 mg/dL (ref 0–149)
VLDL Cholesterol Cal: 24 mg/dL (ref 5–40)

## 2023-11-16 LAB — CBC WITH DIFFERENTIAL/PLATELET
Basophils Absolute: 0 10*3/uL (ref 0.0–0.2)
Basos: 1 %
EOS (ABSOLUTE): 0.3 10*3/uL (ref 0.0–0.4)
Eos: 4 %
Hematocrit: 44.8 % (ref 37.5–51.0)
Hemoglobin: 14.8 g/dL (ref 13.0–17.7)
Immature Grans (Abs): 0 10*3/uL (ref 0.0–0.1)
Immature Granulocytes: 1 %
Lymphocytes Absolute: 1.7 10*3/uL (ref 0.7–3.1)
Lymphs: 24 %
MCH: 29.2 pg (ref 26.6–33.0)
MCHC: 33 g/dL (ref 31.5–35.7)
MCV: 88 fL (ref 79–97)
Monocytes Absolute: 0.5 10*3/uL (ref 0.1–0.9)
Monocytes: 7 %
Neutrophils Absolute: 4.4 10*3/uL (ref 1.4–7.0)
Neutrophils: 63 %
Platelets: 271 10*3/uL (ref 150–450)
RBC: 5.07 x10E6/uL (ref 4.14–5.80)
RDW: 13.6 % (ref 11.6–15.4)
WBC: 7 10*3/uL (ref 3.4–10.8)

## 2023-11-16 LAB — TSH: TSH: 1.85 u[IU]/mL (ref 0.450–4.500)

## 2023-11-16 LAB — HEMOGLOBIN A1C
Est. average glucose Bld gHb Est-mCnc: 148 mg/dL
Hgb A1c MFr Bld: 6.8 % — ABNORMAL HIGH (ref 4.8–5.6)

## 2023-11-27 ENCOUNTER — Encounter: Payer: Self-pay | Admitting: Family Medicine

## 2023-11-28 ENCOUNTER — Other Ambulatory Visit: Payer: Self-pay | Admitting: Family Medicine

## 2023-11-28 MED ORDER — METFORMIN HCL ER 500 MG PO TB24: 500.0000 mg | ORAL_TABLET | Freq: Two times a day (BID) | ORAL | 0 refills | Status: AC

## 2023-12-08 ENCOUNTER — Other Ambulatory Visit: Payer: Self-pay | Admitting: Family Medicine

## 2023-12-08 ENCOUNTER — Other Ambulatory Visit: Payer: Self-pay

## 2023-12-08 DIAGNOSIS — I10 Essential (primary) hypertension: Secondary | ICD-10-CM

## 2024-02-03 ENCOUNTER — Encounter: Payer: Self-pay | Admitting: Family Medicine

## 2024-02-08 MED ORDER — TIRZEPATIDE 2.5 MG/0.5ML ~~LOC~~ SOAJ
2.5000 mg | SUBCUTANEOUS | 0 refills | Status: DC
Start: 2024-02-08 — End: 2024-05-17

## 2024-02-08 MED ORDER — TIRZEPATIDE 7.5 MG/0.5ML ~~LOC~~ SOAJ: 7.5000 mg | SUBCUTANEOUS | 0 refills | Status: DC

## 2024-02-08 MED ORDER — TIRZEPATIDE 5 MG/0.5ML ~~LOC~~ SOAJ: 5.0000 mg | SUBCUTANEOUS | 0 refills | Status: DC

## 2024-02-15 ENCOUNTER — Encounter: Payer: Self-pay | Admitting: Family Medicine

## 2024-02-15 DIAGNOSIS — E119 Type 2 diabetes mellitus without complications: Secondary | ICD-10-CM | POA: Insufficient documentation

## 2024-05-08 ENCOUNTER — Other Ambulatory Visit: Payer: Self-pay

## 2024-05-08 MED ORDER — FINASTERIDE 5 MG PO TABS: ORAL_TABLET | ORAL | 1 refills | Status: AC

## 2024-05-14 ENCOUNTER — Ambulatory Visit: Payer: BC Managed Care – PPO | Admitting: Family Medicine

## 2024-05-17 ENCOUNTER — Ambulatory Visit: Admitting: Family Medicine

## 2024-05-17 ENCOUNTER — Encounter: Payer: Self-pay | Admitting: Family Medicine

## 2024-05-17 VITALS — BP 136/85 | HR 76 | Ht 72.0 in | Wt 255.0 lb

## 2024-05-17 DIAGNOSIS — I10 Essential (primary) hypertension: Secondary | ICD-10-CM

## 2024-05-17 DIAGNOSIS — E119 Type 2 diabetes mellitus without complications: Secondary | ICD-10-CM

## 2024-05-17 LAB — POCT GLYCOSYLATED HEMOGLOBIN (HGB A1C): HbA1c, POC (controlled diabetic range): 5.2 % (ref 0.0–7.0)

## 2024-05-17 LAB — POCT UA - MICROALBUMIN
Albumin/Creatinine Ratio, Urine, POC: <30
Creatinine, POC: 300 mg/dL
Microalbumin Ur, POC: 30 mg/L

## 2024-05-17 MED ORDER — TIRZEPATIDE 10 MG/0.5ML ~~LOC~~ SOAJ: 10.0000 mg | SUBCUTANEOUS | 1 refills | Status: DC

## 2024-05-17 NOTE — Assessment & Plan Note (Signed)
 Weight and  A1c are improved.  Continue titration of Mounjaro to 10 mg as he is tolerating this quite well.  Follow-up in 6 months.

## 2024-05-17 NOTE — Assessment & Plan Note (Addendum)
 His blood pressure remains well controlled with lisinopril  at current strength.  Recommend continuation.

## 2024-05-17 NOTE — Progress Notes (Signed)
 Scott Turner - 48 y.o. male MRN [id number]  Date of birth: 1976-06-20  Subjective Chief Complaint  Patient presents with   Diabetes    HPI Scott Turner is a 48 y.o. male here today for follow-up visit.  Reports that he is feeling well.  He was started on Mounjaro  at previous visit and is doing quite well with this.  Weight is down to 255lbs.  A1c today is 5.2%.  He is tolerating Mounjaro  well without any significant side effects.  Blood pressure mains fairly well-controlled.  He does continue on lisinopril  20 mg daily.  No issues with side effects at this time.  He has not had chest pain, shortness of breath, palpitations, headaches or vision changes.    ROS:  A comprehensive ROS was completed and negative except as noted per HPI   Allergies  Allergen Reactions   Sulfa Antibiotics     Childhood allergy     Past Medical History:  Diagnosis Date   Allergy    Bronchitis    approx 1 month ago    Cancer ([facility])    basal cell carcinoma on neck   Elevated blood pressure reading 07/02/2019    Past Surgical History:  Procedure Laterality Date   bsal cell carcinoma removed from neck      EYE SURGERY     lasik surgery bilateral    TONSILLECTOMY     UMBILICAL HERNIA REPAIR N/A 01/25/2017   Procedure: HERNIA REPAIR UMBILICAL ADULT;  Surgeon: Adalberto Acton, MD;  Location: [facility];  Service: General;  Laterality: N/A;   UMBILICAL HERNIA REPAIR N/A 12/09/2020   Procedure: HERNIA REPAIR UMBILICAL ADULT;  Surgeon: Lockie Rima, MD;  Location: [facility];  Service: General;  Laterality: N/A;    Social History   Socioeconomic History   Marital status: Married    Spouse name: Not on file   Number of children: Not on file   Years of education: Not on file   Highest education level: Not on file  Occupational History   Not on file  Tobacco Use   Smoking status: Former    Current packs/day: 0.00    Average packs/day: 1 pack/day for 10.0 years (10.0 ttl pk-yrs)    Types: Cigarettes     Start date: 09/18/2001    Quit date: 09/19/2011    Years since quitting: 12.6   Smokeless tobacco: Never  Vaping Use   Vaping status: Never Used  Substance and Sexual Activity   Alcohol use: Yes    Comment: maybe 6 pack on weekends    Drug use: No   Sexual activity: Yes  Other Topics Concern   Not on file  Social History Narrative   Not on file   Social Drivers of Health   Financial Resource Strain: Not on file  Food Insecurity: Not on file  Transportation Needs: Not on file  Physical Activity: Not on file  Stress: Not on file  Social Connections: Not on file    Family History  Problem Relation Age of Onset   Heart disease Mother    Cancer Maternal Grandmother    Cancer Maternal Grandfather    Heart disease Paternal Grandmother    Heart disease Paternal Grandfather     Health Maintenance  Topic Date Due   COVID-19 Vaccine (1) Never done   OPHTHALMOLOGY EXAM  Never done   Diabetic kidney evaluation - Urine ACR  Never done   DTaP/Tdap/Td (1 - Tdap) Never done   Pneumococcal Vaccine  18-35 Years old (1 of 2 - PCV) Never done   Colonoscopy  Never done   HEMOGLOBIN A1C  05/14/2024   Hepatitis C Screening  11/14/2024 (Originally 07/11/1995)   HIV Screening  11/14/2024 (Originally 07/10/1992)   INFLUENZA VACCINE  07/27/2024   Diabetic kidney evaluation - eGFR measurement  11/14/2024   FOOT EXAM  05/17/2025   HPV VACCINES  Aged Out   Meningococcal B Vaccine  Aged Out     ----------------------------------------------------------------------------------------------------------------------------------------------------------------------------------------------------------------- Physical Exam BP 136/85 (BP Location: Left Arm, Patient Position: Sitting, Cuff Size: Large)   Pulse 76   Ht 6' (1.829 m)   Wt 255 lb (115.7 kg)   SpO2 95%   BMI 34.58 kg/m   Physical Exam Constitutional:      Appearance: Normal appearance.  Eyes:     General: No scleral  icterus. Cardiovascular:     Rate and Rhythm: Normal rate and regular rhythm.  Pulmonary:     Effort: Pulmonary effort is normal.     Breath sounds: Normal breath sounds.  Neurological:     Mental Status: He is alert.  Psychiatric:        Mood and Affect: Mood normal.        Behavior: Behavior normal.     ------------------------------------------------------------------------------------------------------------------------------------------------------------------------------------------------------------------- Assessment and Plan  Type 2 diabetes mellitus without complications ([facility]) Weight and  A1c are improved.  Continue titration of Mounjaro to 10 mg as he is tolerating this quite well.  Follow-up in 6 months.  Essential hypertension His blood pressure remains well controlled with lisinopril  at current strength.  Recommend continuation.     Meds ordered this encounter  Medications   tirzepatide (MOUNJARO) 10 MG/0.5ML Pen    Sig: Inject 10 mg into the skin once a week.    Dispense:  6 mL    Refill:  1    No follow-ups on file.

## 2024-06-13 ENCOUNTER — Other Ambulatory Visit: Payer: Self-pay

## 2024-06-13 DIAGNOSIS — I10 Essential (primary) hypertension: Secondary | ICD-10-CM

## 2024-06-13 MED ORDER — LISINOPRIL 20 MG PO TABS
20.0000 mg | ORAL_TABLET | Freq: Every day | ORAL | 1 refills | Status: DC
Start: 2024-06-13 — End: 2024-11-16

## 2024-09-06 ENCOUNTER — Other Ambulatory Visit: Payer: Self-pay | Admitting: Family Medicine

## 2024-09-06 NOTE — Telephone Encounter (Signed)
 Copied from CRM #[id number]. Topic: Clinical - Medication Refill >> Sep 06, 2024  3:02 PM Vivian Z wrote: Medication: tirzepatide  (MOUNJARO ) 10 MG/0.5ML Pen  Has the patient contacted their pharmacy? Yes (Agent: If no, request that the patient contact the pharmacy for the refill. If patient does not wish to contact the pharmacy document the reason why and proceed with request.) (Agent: If yes, when and what did the pharmacy advise?)  This is the patient's preferred pharmacy:  [id number] - [place], [place] - [address] [place] [address] Phone: [phone] Fax: [phone]  Is this the correct pharmacy for this prescription? Yes If no, delete pharmacy and type the correct one.   Has the prescription been filled recently? No  Is the patient out of the medication? No  Has the patient been seen for an appointment in the last year OR does the patient have an upcoming appointment? Yes  Can we respond through MyChart? Yes  Agent: Please be advised that Rx refills may take up to 3 business days. We ask that you follow-up with your pharmacy.

## 2024-09-12 NOTE — Telephone Encounter (Signed)
 Requesting rx rf of Mounaro 10mg   Last written 05/17/2024 Last OV 05/17/2024 Upcoming appt 11/15/2024

## 2024-09-13 MED ORDER — TIRZEPATIDE 10 MG/0.5ML ~~LOC~~ SOAJ: 10.0000 mg | SUBCUTANEOUS | 1 refills | Status: DC

## 2024-10-02 ENCOUNTER — Telehealth: Payer: Self-pay

## 2024-10-02 NOTE — Telephone Encounter (Signed)
 Left message for a return call about colon cancer screening. Never had a colonoscopy or Cologuard.

## 2024-11-15 ENCOUNTER — Ambulatory Visit: Admitting: Family Medicine

## 2024-11-16 ENCOUNTER — Encounter: Payer: Self-pay | Admitting: Family Medicine

## 2024-11-16 ENCOUNTER — Ambulatory Visit: Admitting: Family Medicine

## 2024-11-16 VITALS — BP 122/78 | HR 72 | Ht 72.0 in | Wt 232.0 lb

## 2024-11-16 DIAGNOSIS — I10 Essential (primary) hypertension: Secondary | ICD-10-CM

## 2024-11-16 DIAGNOSIS — E119 Type 2 diabetes mellitus without complications: Secondary | ICD-10-CM

## 2024-11-16 DIAGNOSIS — Z125 Encounter for screening for malignant neoplasm of prostate: Secondary | ICD-10-CM

## 2024-11-16 MED ORDER — LISINOPRIL 20 MG PO TABS: 20.0000 mg | ORAL_TABLET | Freq: Every day | ORAL | 1 refills | Status: AC

## 2024-11-16 MED ORDER — TIRZEPATIDE 10 MG/0.5ML ~~LOC~~ SOAJ: 10.0000 mg | SUBCUTANEOUS | 1 refills | Status: AC

## 2024-11-16 MED ORDER — ALBUTEROL SULFATE HFA 108 (90 BASE) MCG/ACT IN AERS: 2.0000 | INHALATION_SPRAY | Freq: Four times a day (QID) | RESPIRATORY_TRACT | 3 refills | Status: AC | PRN

## 2024-11-16 NOTE — Assessment & Plan Note (Signed)
 His blood pressure remains well controlled with lisinopril  at current strength.  Recommend continuation.

## 2024-11-16 NOTE — Assessment & Plan Note (Signed)
 Updating A1c.  Overall doing well at 10mg  strength   Follow-up in 6 months.

## 2024-11-16 NOTE — Progress Notes (Signed)
 Scott Turner - 48 y.o. male MRN [id number]  Date of birth: November 13, 1976  Subjective Chief Complaint  Patient presents with   Diabetes   Hypertension    HPI Scott Turner is a 48 y.o. male here today for follow up visit.   Reports that he is doing well.   Continues on Mounjaro  10mg .  He is tolerating pretty well at current strength, some occasional diarrhea.  Weight is down about 25lbs.    BP is well controlled at current strength.  Doing well with lisinopril  at current strength.  Denies chest pain, shortness of breath, palpitations, headache or vision changes.    ROS:  A comprehensive ROS was completed and negative except as noted per HPI  Allergies  Allergen Reactions   Sulfa Antibiotics     Childhood allergy     Past Medical History:  Diagnosis Date   Allergy    Bronchitis    approx 1 month ago    Cancer (HCC)    basal cell carcinoma on neck   Elevated blood pressure reading 07/02/2019    Past Surgical History:  Procedure Laterality Date   bsal cell carcinoma removed from neck      EYE SURGERY     lasik surgery bilateral    TONSILLECTOMY     UMBILICAL HERNIA REPAIR N/A 01/25/2017   Procedure: HERNIA REPAIR UMBILICAL ADULT;  Surgeon: Mitzie DELENA Freund, MD;  Location: [facility];  Service: General;  Laterality: N/A;   UMBILICAL HERNIA REPAIR N/A 12/09/2020   Procedure: HERNIA REPAIR UMBILICAL ADULT;  Surgeon: Aron Shoulders, MD;  Location: [facility];  Service: General;  Laterality: N/A;    Social History   Socioeconomic History   Marital status: Married    Spouse name: Not on file   Number of children: Not on file   Years of education: Not on file   Highest education level: Not on file  Occupational History   Not on file  Tobacco Use   Smoking status: Former    Current packs/day: 0.00    Average packs/day: 1 pack/day for 10.0 years (10.0 ttl pk-yrs)    Types: Cigarettes    Start date: 09/18/2001    Quit date: 09/19/2011    Years since quitting: 13.1   Smokeless  tobacco: Never  Vaping Use   Vaping status: Never Used  Substance and Sexual Activity   Alcohol use: Yes    Comment: maybe 6 pack on weekends    Drug use: No   Sexual activity: Yes  Other Topics Concern   Not on file  Social History Narrative   Not on file   Social Drivers of Health   Financial Resource Strain: Low Risk  (11/16/2024)   Overall Financial Resource Strain (CARDIA)    Difficulty of Paying Living Expenses: Not hard at all  Food Insecurity: No Food Insecurity (11/16/2024)   Hunger Vital Sign    Worried About Running Out of Food in the Last Year: Never true    Ran Out of Food in the Last Year: Never true  Transportation Needs: No Transportation Needs (11/16/2024)   PRAPARE - Administrator, Civil Service (Medical): No    Lack of Transportation (Non-Medical): No  Physical Activity: Not on file  Stress: Not on file  Social Connections: Not on file    Family History  Problem Relation Age of Onset   Heart disease Mother    Cancer Maternal Grandmother    Cancer Maternal Grandfather    Heart  disease Paternal Grandmother    Heart disease Paternal Grandfather     Health Maintenance  Topic Date Due   OPHTHALMOLOGY EXAM  Never done   HIV Screening  Never done   Hepatitis C Screening  Never done   Colonoscopy  Never done   Diabetic kidney evaluation - eGFR measurement  11/14/2024   Influenza Vaccine  03/26/2025 (Originally 07/27/2024)   Pneumococcal Vaccine (1 of 2 - PCV) 11/16/2025 (Originally 07/10/1996)   Hepatitis B Vaccines 19-59 Average Risk (1 of 3 - 19+ 3-dose series) 11/16/2025 (Originally 07/10/1996)   COVID-19 Vaccine (1) 12/02/2025 (Originally 07/10/1982)   HEMOGLOBIN A1C  11/17/2024   Diabetic kidney evaluation - Urine ACR  05/17/2025   FOOT EXAM  05/17/2025   DTaP/Tdap/Td (2 - Td or Tdap) 01/20/2027   HPV VACCINES  Aged Out   Meningococcal B Vaccine  Aged Out      ----------------------------------------------------------------------------------------------------------------------------------------------------------------------------------------------------------------- Physical Exam BP 122/78 (BP Location: Right Arm, Patient Position: Sitting, Cuff Size: Normal)   Pulse 72   Ht 6' (1.829 m)   Wt 232 lb (105.2 kg)   SpO2 99%   BMI 31.46 kg/m   Physical Exam Constitutional:      Appearance: Normal appearance.  HENT:     Head: Normocephalic and atraumatic.  Eyes:     General: No scleral icterus. Cardiovascular:     Rate and Rhythm: Normal rate and regular rhythm.  Pulmonary:     Effort: Pulmonary effort is normal.     Breath sounds: Normal breath sounds.  Neurological:     Mental Status: He is alert.  Psychiatric:        Mood and Affect: Mood normal.        Behavior: Behavior normal.     ------------------------------------------------------------------------------------------------------------------------------------------------------------------------------------------------------------------- Assessment and Plan  Essential hypertension His blood pressure remains well controlled with lisinopril  at current strength.  Recommend continuation.    Type 2 diabetes mellitus without complications (HCC) Updating A1c.  Overall doing well at 10mg  strength   Follow-up in 6 months.   Meds ordered this encounter  Medications   albuterol  (PROAIR  HFA) 108 (90 Base) MCG/ACT inhaler    Sig: Inhale 2 puffs into the lungs every 6 (six) hours as needed for shortness of breath.    Dispense:  8 g    Refill:  3   lisinopril  (ZESTRIL ) 20 MG tablet    Sig: Take 1 tablet (20 mg total) by mouth daily.    Dispense:  90 tablet    Refill:  1    **Patient requests 90 days supply**   tirzepatide  (MOUNJARO ) 10 MG/0.5ML Pen    Sig: Inject 10 mg into the skin once a week.    Dispense:  6 mL    Refill:  1    No follow-ups on file.

## 2024-11-17 LAB — CMP14+EGFR
ALT: 31 IU/L (ref 0–44)
AST: 20 IU/L (ref 0–40)
Albumin: 4.1 g/dL (ref 4.1–5.1)
Alkaline Phosphatase: 104 IU/L (ref 47–123)
BUN/Creatinine Ratio: 8 — ABNORMAL LOW (ref 9–20)
BUN: 7 mg/dL (ref 6–24)
Bilirubin Total: 0.5 mg/dL (ref 0.0–1.2)
CO2: 22 mmol/L (ref 20–29)
Calcium: 9.2 mg/dL (ref 8.7–10.2)
Chloride: 101 mmol/L (ref 96–106)
Creatinine, Ser: 0.88 mg/dL (ref 0.76–1.27)
Globulin, Total: 2.6 g/dL (ref 1.5–4.5)
Glucose: 131 mg/dL — ABNORMAL HIGH (ref 70–99)
Potassium: 4 mmol/L (ref 3.5–5.2)
Sodium: 139 mmol/L (ref 134–144)
Total Protein: 6.7 g/dL (ref 6.0–8.5)
eGFR: 107 mL/min/1.73 (ref >59)

## 2024-11-17 LAB — CBC WITH DIFFERENTIAL/PLATELET
Basophils Absolute: 0 x10E3/uL (ref 0.0–0.2)
Basos: 1 %
EOS (ABSOLUTE): 0.3 x10E3/uL (ref 0.0–0.4)
Eos: 4 %
Hematocrit: 44.2 % (ref 37.5–51.0)
Hemoglobin: 15.2 g/dL (ref 13.0–17.7)
Immature Grans (Abs): 0 x10E3/uL (ref 0.0–0.1)
Immature Granulocytes: 0 %
Lymphocytes Absolute: 1.6 x10E3/uL (ref 0.7–3.1)
Lymphs: 22 %
MCH: 30 pg (ref 26.6–33.0)
MCHC: 34.4 g/dL (ref 31.5–35.7)
MCV: 87 fL (ref 79–97)
Monocytes Absolute: 0.4 x10E3/uL (ref 0.1–0.9)
Monocytes: 5 %
Neutrophils Absolute: 5.1 x10E3/uL (ref 1.4–7.0)
Neutrophils: 68 %
Platelets: 287 x10E3/uL (ref 150–450)
RBC: 5.07 x10E6/uL (ref 4.14–5.80)
RDW: 13.3 % (ref 11.6–15.4)
WBC: 7.5 x10E3/uL (ref 3.4–10.8)

## 2024-11-17 LAB — LIPID PANEL WITH LDL/HDL RATIO
Cholesterol, Total: 150 mg/dL (ref 100–199)
HDL: 38 mg/dL — ABNORMAL LOW (ref >39)
LDL Chol Calc (NIH): 94 mg/dL (ref 0–99)
LDL/HDL Ratio: 2.5 ratio (ref 0.0–3.6)
Triglycerides: 93 mg/dL (ref 0–149)
VLDL Cholesterol Cal: 18 mg/dL (ref 5–40)

## 2024-11-17 LAB — PSA: Prostate Specific Ag, Serum: 0.5 ng/mL (ref 0.0–4.0)

## 2024-11-17 LAB — HEMOGLOBIN A1C
Est. average glucose Bld gHb Est-mCnc: 103 mg/dL
Hgb A1c MFr Bld: 5.2 % (ref 4.8–5.6)

## 2024-11-26 ENCOUNTER — Ambulatory Visit: Payer: Self-pay | Admitting: Family Medicine

## 2025-01-30 ENCOUNTER — Telehealth: Payer: Self-pay

## 2025-01-30 ENCOUNTER — Other Ambulatory Visit (HOSPITAL_COMMUNITY): Payer: Self-pay

## 2025-01-30 NOTE — Telephone Encounter (Signed)
 NA

## 2025-01-30 NOTE — Telephone Encounter (Signed)
 Order in Epic is for Mounjaro  10 mg/.5mL. Please clarify which strength patient is needing (10 mg or 7.5 mg).

## 2025-01-30 NOTE — Telephone Encounter (Signed)
 Spoke with patient, he did verify that he is taking the Mounjaro  10mg  auto injector.

## 2025-01-30 NOTE — Telephone Encounter (Signed)
-----   Message from [facility] Christal N sent at 01/24/2025 10:58 AM EST ----- Regarding: PA request Received a PA request for pt's mounjaro  7.5mg /0.37ml auto-injectors from covermymeds.   KEY: [id number]

## 2025-01-31 ENCOUNTER — Other Ambulatory Visit (HOSPITAL_COMMUNITY): Payer: Self-pay

## 2025-01-31 NOTE — Telephone Encounter (Signed)
 Per test claim Mounjaro  10 mg is being covered on plan and has a PA approval on file.   Patient should have access to Mounjaro  10 mg/5mL.

## 2025-05-17 ENCOUNTER — Ambulatory Visit: Admitting: Family Medicine
